# Patient Record
Sex: Female | Born: 1950
Health system: Southern US, Community
[De-identification: ages and names within clinical notes are randomized; demographics above are authoritative.]

## PROBLEM LIST (undated history)

## (undated) DIAGNOSIS — F17200 Nicotine dependence, unspecified, uncomplicated: Secondary | ICD-10-CM

## (undated) DIAGNOSIS — M549 Dorsalgia, unspecified: Secondary | ICD-10-CM

## (undated) DIAGNOSIS — E559 Vitamin D deficiency, unspecified: Secondary | ICD-10-CM

## (undated) DIAGNOSIS — K5900 Constipation, unspecified: Secondary | ICD-10-CM

## (undated) DIAGNOSIS — E669 Obesity, unspecified: Secondary | ICD-10-CM

## (undated) DIAGNOSIS — IMO0001 Reserved for inherently not codable concepts without codable children: Secondary | ICD-10-CM

## (undated) DIAGNOSIS — G5600 Carpal tunnel syndrome, unspecified upper limb: Secondary | ICD-10-CM

## (undated) HISTORY — DX: Vitamin D deficiency, unspecified: E55.9

## (undated) HISTORY — DX: Nicotine dependence, unspecified, uncomplicated: F17.200

## (undated) HISTORY — PX: DILATION AND CURETTAGE OF UTERUS: SHX78

## (undated) HISTORY — PX: WISDOM TOOTH EXTRACTION: SHX21

## (undated) HISTORY — DX: Carpal tunnel syndrome, unspecified upper limb: G56.00

## (undated) HISTORY — DX: Reserved for inherently not codable concepts without codable children: IMO0001

## (undated) HISTORY — DX: Constipation, unspecified: K59.00

## (undated) HISTORY — PX: OTHER SURGICAL HISTORY: SHX169

## (undated) HISTORY — PX: BACK SURGERY: SHX140

## (undated) HISTORY — DX: Dorsalgia, unspecified: M54.9

## (undated) HISTORY — DX: Obesity, unspecified: E66.9

---

## 1982-05-25 HISTORY — PX: TUBAL LIGATION: SHX77

## 1992-05-25 HISTORY — PX: DILATION AND CURETTAGE OF UTERUS: SHX78

## 1997-05-25 HISTORY — PX: BACK SURGERY: SHX140

## 1999-07-02 ENCOUNTER — Ambulatory Visit (HOSPITAL_COMMUNITY): Admission: RE | Admit: 1999-07-02 | Discharge: 1999-07-02 | Payer: Self-pay | Admitting: Family Medicine

## 1999-07-02 ENCOUNTER — Encounter: Payer: Self-pay | Admitting: Family Medicine

## 2000-11-27 ENCOUNTER — Emergency Department (HOSPITAL_COMMUNITY): Admission: EM | Admit: 2000-11-27 | Discharge: 2000-11-27 | Payer: Self-pay | Admitting: *Deleted

## 2000-11-27 ENCOUNTER — Encounter: Payer: Self-pay | Admitting: *Deleted

## 2004-07-29 ENCOUNTER — Ambulatory Visit: Payer: Self-pay | Admitting: Internal Medicine

## 2004-07-29 ENCOUNTER — Encounter: Admission: RE | Admit: 2004-07-29 | Discharge: 2004-07-29 | Payer: Self-pay | Admitting: Family Medicine

## 2004-08-12 ENCOUNTER — Ambulatory Visit: Payer: Self-pay | Admitting: Internal Medicine

## 2014-06-30 ENCOUNTER — Encounter: Payer: Self-pay | Admitting: Internal Medicine

## 2015-01-01 ENCOUNTER — Encounter: Payer: Self-pay | Admitting: Internal Medicine

## 2015-02-16 ENCOUNTER — Ambulatory Visit (INDEPENDENT_AMBULATORY_CARE_PROVIDER_SITE_OTHER): Payer: BLUE CROSS/BLUE SHIELD

## 2015-02-16 DIAGNOSIS — Z23 Encounter for immunization: Secondary | ICD-10-CM | POA: Diagnosis not present

## 2019-09-01 DIAGNOSIS — H524 Presbyopia: Secondary | ICD-10-CM | POA: Diagnosis not present

## 2019-09-01 DIAGNOSIS — Z0100 Encounter for examination of eyes and vision without abnormal findings: Secondary | ICD-10-CM | POA: Diagnosis not present

## 2019-09-01 DIAGNOSIS — H2513 Age-related nuclear cataract, bilateral: Secondary | ICD-10-CM | POA: Diagnosis not present

## 2019-12-07 DIAGNOSIS — Z6841 Body Mass Index (BMI) 40.0 and over, adult: Secondary | ICD-10-CM | POA: Diagnosis not present

## 2019-12-07 DIAGNOSIS — Z0189 Encounter for other specified special examinations: Secondary | ICD-10-CM | POA: Diagnosis not present

## 2019-12-07 DIAGNOSIS — K5900 Constipation, unspecified: Secondary | ICD-10-CM | POA: Diagnosis not present

## 2019-12-07 DIAGNOSIS — R030 Elevated blood-pressure reading, without diagnosis of hypertension: Secondary | ICD-10-CM | POA: Diagnosis not present

## 2019-12-07 DIAGNOSIS — Z716 Tobacco abuse counseling: Secondary | ICD-10-CM | POA: Diagnosis not present

## 2019-12-07 DIAGNOSIS — M79642 Pain in left hand: Secondary | ICD-10-CM | POA: Diagnosis not present

## 2019-12-09 DIAGNOSIS — G5602 Carpal tunnel syndrome, left upper limb: Secondary | ICD-10-CM | POA: Diagnosis not present

## 2019-12-14 DIAGNOSIS — K5900 Constipation, unspecified: Secondary | ICD-10-CM | POA: Diagnosis not present

## 2019-12-14 DIAGNOSIS — Z6841 Body Mass Index (BMI) 40.0 and over, adult: Secondary | ICD-10-CM | POA: Diagnosis not present

## 2019-12-14 DIAGNOSIS — R030 Elevated blood-pressure reading, without diagnosis of hypertension: Secondary | ICD-10-CM | POA: Diagnosis not present

## 2019-12-14 DIAGNOSIS — Z716 Tobacco abuse counseling: Secondary | ICD-10-CM | POA: Diagnosis not present

## 2019-12-14 LAB — VITAMIN D 25 HYDROXY (VIT D DEFICIENCY, FRACTURES): Vit D, 25-Hydroxy: 15.4

## 2019-12-14 LAB — COMPREHENSIVE METABOLIC PANEL: Calcium: 9.3 (ref 8.7–10.7)

## 2019-12-14 LAB — HEPATIC FUNCTION PANEL
ALT: 12 (ref 7–35)
AST: 16 (ref 13–35)
Alkaline Phosphatase: 103 (ref 25–125)
Bilirubin, Total: 0.3

## 2019-12-14 LAB — LIPID PANEL
Cholesterol: 181 (ref 0–200)
HDL: 43 (ref 35–70)
LDL Cholesterol: 111
LDl/HDL Ratio: 4.2
Triglycerides: 153 (ref 40–160)

## 2019-12-14 LAB — BASIC METABOLIC PANEL
Chloride: 104 (ref 99–108)
Creatinine: 0.8 (ref 0.5–1.1)
Glucose: 96
Potassium: 4.5 (ref 3.4–5.3)
Sodium: 138 (ref 137–147)

## 2019-12-14 LAB — CBC AND DIFFERENTIAL
HCT: 43 (ref 36–46)
Hemoglobin: 14.5 (ref 12.0–16.0)
Platelets: 281 (ref 150–399)
WBC: 6.8

## 2019-12-14 LAB — TSH: TSH: 2.1 (ref 0.41–5.90)

## 2019-12-19 DIAGNOSIS — G5602 Carpal tunnel syndrome, left upper limb: Secondary | ICD-10-CM | POA: Diagnosis not present

## 2019-12-22 ENCOUNTER — Other Ambulatory Visit (HOSPITAL_COMMUNITY): Payer: Self-pay | Admitting: Internal Medicine

## 2019-12-22 DIAGNOSIS — Z716 Tobacco abuse counseling: Secondary | ICD-10-CM | POA: Diagnosis not present

## 2019-12-22 DIAGNOSIS — F17200 Nicotine dependence, unspecified, uncomplicated: Secondary | ICD-10-CM | POA: Diagnosis not present

## 2019-12-22 DIAGNOSIS — Z6841 Body Mass Index (BMI) 40.0 and over, adult: Secondary | ICD-10-CM | POA: Diagnosis not present

## 2019-12-22 DIAGNOSIS — Z1231 Encounter for screening mammogram for malignant neoplasm of breast: Secondary | ICD-10-CM

## 2019-12-22 DIAGNOSIS — K5900 Constipation, unspecified: Secondary | ICD-10-CM | POA: Diagnosis not present

## 2019-12-22 DIAGNOSIS — R030 Elevated blood-pressure reading, without diagnosis of hypertension: Secondary | ICD-10-CM | POA: Diagnosis not present

## 2019-12-22 DIAGNOSIS — Z0189 Encounter for other specified special examinations: Secondary | ICD-10-CM | POA: Diagnosis not present

## 2019-12-22 DIAGNOSIS — M79642 Pain in left hand: Secondary | ICD-10-CM | POA: Diagnosis not present

## 2019-12-22 DIAGNOSIS — Z0001 Encounter for general adult medical examination with abnormal findings: Secondary | ICD-10-CM | POA: Diagnosis not present

## 2019-12-27 ENCOUNTER — Encounter: Payer: Self-pay | Admitting: Gastroenterology

## 2019-12-27 ENCOUNTER — Ambulatory Visit (HOSPITAL_COMMUNITY): Payer: Self-pay

## 2019-12-27 ENCOUNTER — Ambulatory Visit (HOSPITAL_COMMUNITY)
Admission: RE | Admit: 2019-12-27 | Discharge: 2019-12-27 | Disposition: A | Discharge status: Home / Self Care | Payer: Medicare HMO | Source: Ambulatory Visit | Attending: Internal Medicine | Admitting: Internal Medicine

## 2019-12-27 ENCOUNTER — Other Ambulatory Visit: Payer: Self-pay

## 2019-12-27 DIAGNOSIS — Z1231 Encounter for screening mammogram for malignant neoplasm of breast: Secondary | ICD-10-CM | POA: Insufficient documentation

## 2019-12-28 ENCOUNTER — Other Ambulatory Visit: Payer: Self-pay

## 2019-12-28 DIAGNOSIS — R202 Paresthesia of skin: Secondary | ICD-10-CM

## 2020-01-04 ENCOUNTER — Encounter: Payer: Self-pay | Admitting: Adult Health

## 2020-01-04 ENCOUNTER — Ambulatory Visit: Payer: Medicare HMO | Admitting: Adult Health

## 2020-01-04 ENCOUNTER — Other Ambulatory Visit (HOSPITAL_COMMUNITY)
Admission: RE | Admit: 2020-01-04 | Discharge: 2020-01-04 | Disposition: A | Discharge status: Home / Self Care | Payer: Medicare HMO | Source: Ambulatory Visit | Attending: Adult Health | Admitting: Adult Health

## 2020-01-04 VITALS — BP 148/80 | HR 76 | Ht 62.0 in | Wt 264.4 lb

## 2020-01-04 DIAGNOSIS — Z1151 Encounter for screening for human papillomavirus (HPV): Secondary | ICD-10-CM | POA: Diagnosis not present

## 2020-01-04 DIAGNOSIS — Z01419 Encounter for gynecological examination (general) (routine) without abnormal findings: Secondary | ICD-10-CM | POA: Diagnosis not present

## 2020-01-04 DIAGNOSIS — Z1211 Encounter for screening for malignant neoplasm of colon: Secondary | ICD-10-CM | POA: Diagnosis not present

## 2020-01-04 LAB — HEMOCCULT GUIAC POC 1CARD (OFFICE): Fecal Occult Blood, POC: NEGATIVE

## 2020-01-04 NOTE — Progress Notes (Signed)
  Subjective:     Patient ID: Amy Freeman, female   DOB: [birth year], 69 y.o.   MRN: [id number]  HPI Amy Freeman is a 69 year old white female,married, PM in for pelvic and pap. Last pap about 15 years ago. PCP is Dr Nevada Crane.  Review of Systems Has decreased libido Denies any vaginal bleeding in last 11 years, did have after trying HRT 2010 Has tingling and burin in fingers has brace left wrist, getting nerve study soon to rule out CTS. Reviewed past medical,surgical, social and family history. Reviewed medications and allergies.     Objective:   Physical Exam BP (!) 148/80 (BP Location: Right Arm, Patient Position: Sitting, Cuff Size: Normal)   Pulse 76   Ht 5\' 2"  (1.575 m)   Wt 264 lb 6.4 oz (119.9 kg)   BMI 48.36 kg/m  Skin warm and dry.Pelvic: external genitalia is normal in appearance,has small,sebaceous cyst inner labia, vagina: pale with loss of moisture and rugae,urethra has no lesions or masses noted, cervix:smooth and bulbous, uterus: normal size, shape and contour, slighlty tender, no masses felt, adnexa: no masses or tenderness noted. Bladder is non tender and no masses felt.  On rectal exam has good tone, no masses or hemorrhoids felt, hemoccult ws negative.  Upstream - 01/04/20 1057      Pregnancy Intention Screening   Does the patient want to become pregnant in the next year? No    Does the patient's partner want to become pregnant in the next year? No    Would the patient like to discuss contraceptive options today? No      Contraception Wrap Up   Current Method Female Sterilization   postmenopausal   End Method Female Sterilization   postmenopausal   Contraception Counseling Provided No         Fall risk is low AA is 0 PHQ 9 score is 0 Examination chaperoned by Celene Squibb LPN     Assessment:     1. Encounter for gynecological examination with Papanicolaou smear of cervix Pap sent Physical with PCP Labs with PCP Mammogram yearly -colonoscopy in  October   2. Encounter for screening fecal occult blood testing     Plan:     Pap in 3 years if normal

## 2020-01-05 DIAGNOSIS — Z716 Tobacco abuse counseling: Secondary | ICD-10-CM | POA: Diagnosis not present

## 2020-01-05 DIAGNOSIS — F17200 Nicotine dependence, unspecified, uncomplicated: Secondary | ICD-10-CM | POA: Diagnosis not present

## 2020-01-08 LAB — CYTOLOGY - PAP
Comment: NEGATIVE
Diagnosis: NEGATIVE
High risk HPV: NEGATIVE

## 2020-01-11 ENCOUNTER — Other Ambulatory Visit: Payer: Self-pay

## 2020-01-11 ENCOUNTER — Ambulatory Visit (INDEPENDENT_AMBULATORY_CARE_PROVIDER_SITE_OTHER): Payer: Medicare HMO | Admitting: Family Medicine

## 2020-01-11 ENCOUNTER — Encounter (INDEPENDENT_AMBULATORY_CARE_PROVIDER_SITE_OTHER): Payer: Self-pay | Admitting: Family Medicine

## 2020-01-11 VITALS — BP 138/79 | HR 73 | Temp 97.9°F | Ht 60.0 in | Wt 260.0 lb

## 2020-01-11 DIAGNOSIS — Z6841 Body Mass Index (BMI) 40.0 and over, adult: Secondary | ICD-10-CM

## 2020-01-11 DIAGNOSIS — E7849 Other hyperlipidemia: Secondary | ICD-10-CM

## 2020-01-11 DIAGNOSIS — M549 Dorsalgia, unspecified: Secondary | ICD-10-CM

## 2020-01-11 DIAGNOSIS — K5909 Other constipation: Secondary | ICD-10-CM

## 2020-01-11 DIAGNOSIS — E559 Vitamin D deficiency, unspecified: Secondary | ICD-10-CM | POA: Diagnosis not present

## 2020-01-11 DIAGNOSIS — F17200 Nicotine dependence, unspecified, uncomplicated: Secondary | ICD-10-CM | POA: Diagnosis not present

## 2020-01-11 DIAGNOSIS — R5383 Other fatigue: Secondary | ICD-10-CM

## 2020-01-11 DIAGNOSIS — Z1331 Encounter for screening for depression: Secondary | ICD-10-CM

## 2020-01-11 DIAGNOSIS — R0602 Shortness of breath: Secondary | ICD-10-CM

## 2020-01-11 DIAGNOSIS — Z0289 Encounter for other administrative examinations: Secondary | ICD-10-CM

## 2020-01-11 DIAGNOSIS — G8929 Other chronic pain: Secondary | ICD-10-CM

## 2020-01-11 NOTE — Progress Notes (Signed)
Dear Dr. Nevada Crane,   Thank you for referring Amy Freeman to our clinic. The following note includes my evaluation and treatment recommendations.  Chief Complaint:   OBESITY Amy Freeman (MR# [id number]) is a 69 y.o. female who presents for evaluation and treatment of obesity and related comorbidities. Current BMI is Body mass index is 50.78 kg/m. Amy Freeman has been struggling with her weight for many years and has been unsuccessful in either losing weight, maintaining weight loss, or reaching her healthy weight goal.  Amy Freeman is currently in the action stage of change and ready to dedicate time achieving and maintaining a healthier weight. Amy Freeman is interested in becoming our patient and working on intensive lifestyle modifications including (but not limited to) diet and exercise for weight loss.  Amy Freeman is retired since 2008.  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  She lives with her husband, Amy Freeman.  Her first meal of the day is at noon.  She skips breakfast daily due to getting up later.  She had labs (CBC, CMP, FLP, TSH, and vitamin D) with her PCP recently on 12/14/2019.  Amy Freeman's habits were reviewed today and are as follows: Her family eats meals together, she thinks her family will eat healthier with her, her desired weight loss is 119+ pounds, she has been heavy most of her life, her heaviest weight ever was 270 pounds, she is a picky eater and doesn't like to eat healthier foods, she craves sweet and salty foods and something quick and easy, she skips breakfast frequently, she is frequently drinking liquids with calories, she frequently makes poor food choices, she frequently eats larger portions than normal and she struggles with emotional eating.  Depression Screen Amy Freeman's Food and Mood (modified PHQ-9) score was 13.  Depression screen PHQ 2/9 01/11/2020  Decreased Interest 3  Down, Depressed, Hopeless 3  PHQ - 2 Score 6  Altered sleeping 1  Tired,  decreased energy 2  Change in appetite 2  Feeling bad or failure about yourself  2  Trouble concentrating 0  Moving slowly or fidgety/restless 0  Suicidal thoughts 0  PHQ-9 Score 13  Difficult doing work/chores Very difficult   Subjective:   1. Other fatigue Charmayne denies daytime somnolence and denies waking up still tired. Patent has a history of symptoms of occasional snoring. Amy Freeman generally gets 7 or 8 hours of sleep per night, and states that she has generally restful sleep. Snoring is sometimes present. Apneic episodes are not present. Epworth Sleepiness Score is 2.  2. SOB (shortness of breath) on exertion Gregory notes increasing shortness of breath with exercising and seems to be worsening over time with weight gain. She notes getting out of breath sooner with activity than she used to. This has gotten worse recently. Josy denies shortness of breath at rest or orthopnea.  3. Smoking Livy has smoked 1 pack per day for 40+ years.  She is currently taking Chantix to try to quit.  She was on Wellbutrin years ago and says it did not help at all.  4. Vitamin D deficiency Amy Freeman's Vitamin D level was 15.4 on 12/14/2019. She is currently taking prescription vitamin D 50,000 IU each week. She denies nausea, vomiting or muscle weakness.  5. Chronic constipation CBC, CMP on 12/14/2019 were all within normal limits.  6. Chronic back pain, unspecified back location, unspecified back pain laterality Itxel is treated for this by her PCP.  7. Other hyperlipidemia Amy Freeman has hyperlipidemia  and has been trying to improve her cholesterol levels with intensive lifestyle modification including a low saturated fat diet, exercise and weight loss. She denies any chest pain, claudication or myalgias.  Labs on 12/14/2019 showed elevated LDL to 111 and triglycerides at 153.  HDL 43.  She is on no medication and would like to try lifestyle changes.  8. Depression screen Amy Freeman was screened for depression as  part of her new patient workup.  Assessment/Plan:   1. Other fatigue Amy Freeman does feel that her weight is causing her energy to be lower than it should be. Fatigue may be related to obesity, depression or many other causes. Labs will be ordered, and in the meanwhile, Amy Freeman will focus on self care including making healthy food choices, increasing physical activity and focusing on stress reduction. - EKG 12-Lead - Hemoglobin A1c - Insulin, random - Vitamin B12 - Folate - T3 - T4  2. SOB (shortness of breath) on exertion Amy Freeman does feel that she gets out of breath more easily that she used to when she exercises. Amy Freeman's shortness of breath appears to be obesity related and exercise induced. She has agreed to work on weight loss and gradually increase exercise to treat her exercise induced shortness of breath. Will continue to monitor closely.  3. Smoking Very strongly urged to quit smoking to reduce cardiovascular risk.  4. Vitamin D deficiency Low Vitamin D level contributes to fatigue and are associated with obesity, breast, and colon cancer. She agrees to continue to take prescription Vitamin D @50 ,000 IU every week and will follow-up for routine testing of Vitamin D, at least 2-3 times per year to avoid over-replacement.  5. Chronic constipation Amy Freeman was informed that a decrease in bowel movement frequency is normal while losing weight, but stools should not be hard or painful. Orders and follow up as documented in patient record.  Increase hydration, increase fiber by following prudent nutritional plan.  Counseling Getting to Good Bowel Health: Your goal is to have one soft bowel movement each day. Drink at least 8 glasses of water each day. Eat plenty of fiber (goal is over 25 grams each day). It is best to get most of your fiber from dietary sources which includes leafy green vegetables, fresh fruit, and whole grains. You may need to add fiber with the help of OTC fiber supplements.  These include Metamucil, Citrucel, and Flaxseed. If you are still having trouble, try adding Miralax or Magnesium Citrate. If all of these changes do not work, Cabin crew.  6. Chronic back pain, unspecified back location, unspecified back pain laterality Will follow because mobility and pain control are important for weight management.  Weight loss will be helpful.  7. Other hyperlipidemia Cardiovascular risk and specific lipid/LDL goals reviewed.  We discussed several lifestyle modifications today and Aubreigh will continue to work on diet, exercise and weight loss efforts. Orders and follow up as documented in patient record.   Counseling Intensive lifestyle modifications are the first line treatment for this issue. . Dietary changes: Increase soluble fiber. Decrease simple carbohydrates. . Exercise changes: Moderate to vigorous-intensity aerobic activity 150 minutes per week if tolerated. . Lipid-lowering medications: see documented in medical record.  8. Depression screen Numa had a positive depression screening. Depression is commonly associated with obesity and often results in emotional eating behaviors. We will monitor this closely and work on CBT to help improve the non-hunger eating patterns. Referral to Psychology may be required if no improvement is seen as  she continues in our clinic.  9. Class 3 severe obesity with serious comorbidity and body mass index (BMI) of 50.0 to 59.9 in adult, unspecified obesity type [facility]) [place] is currently in the action stage of change and her goal is to continue with weight loss efforts. I recommend Shomari begin the structured treatment plan as follows:  She has agreed to the Category 1 Plan.  Exercise goals: No exercise has been prescribed at this time. As is.   Behavioral modification strategies: decreasing liquid calories, no skipping meals, meal planning and cooking strategies and planning for success.  She was informed of the  importance of frequent follow-up visits to maximize her success with intensive lifestyle modifications for her multiple health conditions. She was informed we would discuss her lab results at her next visit unless there is a critical issue that needs to be addressed sooner. Niara agreed to keep her next visit at the agreed upon time to discuss these results.  Objective:   Blood pressure 138/79, pulse 73, temperature 97.9 F (36.6 C), height 5' (1.524 m), weight 260 lb (117.9 kg), SpO2 96 %. Body mass index is 50.78 kg/m.  EKG: Normal sinus rhythm, rate 74 bpm.  Indirect Calorimeter completed today shows a VO2 of 224 and a REE of 1559.  Her calculated basal metabolic rate is 6269 thus her basal metabolic rate is worse than expected.  General: Cooperative, alert, well developed, in no acute distress. HEENT: Conjunctivae and lids unremarkable. Cardiovascular: Regular rhythm.  Lungs: Normal work of breathing. Neurologic: No focal deficits.   Obesity Behavioral Intervention Visit Documentation for Insurance:   Approximately 15 minutes were spent on the discussion below.  ASK: We discussed the diagnosis of obesity with Bessye today and Arya agreed to give [place] permission to discuss obesity behavioral modification therapy today.  ASSESS: Taygen has the diagnosis of obesity and her BMI today is 50.2. Wessie is in the action stage of change.   ADVISE: Ivie was educated on the multiple health risks of obesity as well as the benefit of weight loss to improve her health. She was advised of the need for long term treatment and the importance of lifestyle modifications to improve her current health and to decrease her risk of future health problems.  AGREE: Multiple dietary modification options and treatment options were discussed and Modesty agreed to follow the recommendations documented in the above note.  ARRANGE: Bennette was educated on the importance of frequent visits to treat obesity as  outlined per CMS and USPSTF guidelines and agreed to schedule her next follow up appointment today.  Attestation Statements:   Reviewed by clinician on day of visit: allergies, medications, problem list, medical history, surgical history, family history, social history, and previous encounter notes.  I, Water quality scientist, CMA, am acting as Location manager for [employer], DO.  I have reviewed the above documentation for accuracy and completeness, and I agree with the above. -  Mellody Dance, DO

## 2020-01-12 LAB — FOLATE: Folate: 12.5 ng/mL (ref >3.0)

## 2020-01-12 LAB — INSULIN, RANDOM: INSULIN: 14 u[IU]/mL (ref 2.6–24.9)

## 2020-01-12 LAB — T3: T3, Total: 129 ng/dL (ref 71–180)

## 2020-01-12 LAB — HEMOGLOBIN A1C
Est. average glucose Bld gHb Est-mCnc: 128 mg/dL
Hgb A1c MFr Bld: 6.1 % — ABNORMAL HIGH (ref 4.8–5.6)

## 2020-01-12 LAB — VITAMIN B12: Vitamin B-12: 729 pg/mL (ref 232–1245)

## 2020-01-12 LAB — T4: T4, Total: 8.1 ug/dL (ref 4.5–12.0)

## 2020-01-15 ENCOUNTER — Other Ambulatory Visit (INDEPENDENT_AMBULATORY_CARE_PROVIDER_SITE_OTHER): Payer: Self-pay

## 2020-01-15 ENCOUNTER — Encounter (INDEPENDENT_AMBULATORY_CARE_PROVIDER_SITE_OTHER): Payer: Self-pay | Admitting: Family Medicine

## 2020-01-16 ENCOUNTER — Other Ambulatory Visit: Payer: Self-pay

## 2020-01-16 ENCOUNTER — Ambulatory Visit (INDEPENDENT_AMBULATORY_CARE_PROVIDER_SITE_OTHER): Payer: Medicare HMO | Admitting: Neurology

## 2020-01-16 DIAGNOSIS — G5602 Carpal tunnel syndrome, left upper limb: Secondary | ICD-10-CM

## 2020-01-16 DIAGNOSIS — R202 Paresthesia of skin: Secondary | ICD-10-CM | POA: Diagnosis not present

## 2020-01-16 NOTE — Procedures (Signed)
[facility]  [address], [place]  [place], [place] [address] Tel: [phone] Fax:  [phone] Test Date:  01/16/2020  Patient: Amy Freeman DOB: Nov 30, 1950 Physician: Narda Amber, DO  Sex: Female Height: 5' " Ref Phys: Berle Mull, M.D.  ID#: [id number] Temp: 35.0 Technician:    Patient Complaints: This is a 69 year old female referred for evaluation of left hand numbness and tingling.  NCV & EMG Findings: Extensive electrodiagnostic testing of the left upper extremity shows:  1. Left median sensory response is absent.  Left ulnar sensory responses within normal limits. 2. Left median motor response shows prolonged distal onset latency (6.4 ms) and reduced amplitude (3.8 mV).  Left ulnar motor responses within normal limits.   3. Chronic motor axonal loss changes are seen affecting the left abductor pollicis brevis muscle, without accompanying active denervation.    Impression: 1. Left median neuropathy at or distal to the wrist (severe), consistent with a clinical diagnosis of carpal tunnel syndrome.  2. There is no evidence of a cervical radiculopathy affecting the left upper extremity.   ___________________________ Narda Amber, DO    Nerve Conduction Studies Anti Sensory Summary Table   Stim Site NR Peak (ms) Norm Peak (ms) P-T Amp (V) Norm P-T Amp  Left Median Anti Sensory (2nd Digit)  35C  Wrist NR  <3.8  >10  Left Ulnar Anti Sensory (5th Digit)  35C  Wrist    2.6 <3.2 21.0 >5   Motor Summary Table   Stim Site NR Onset (ms) Norm Onset (ms) O-P Amp (mV) Norm O-P Amp Site1 Site2 Delta-0 (ms) Dist (cm) Vel (m/s) Norm Vel (m/s)  Left Median Motor (Abd Poll Brev)  35C  Wrist    6.4 <4.0 3.8 >5 Elbow Wrist 5.1 26.0 51 >50  Elbow    11.5  3.7         Left Ulnar Motor (Abd Dig Minimi)  35C  Wrist    2.3 <3.1 7.4 >7 B Elbow Wrist 3.8 22.0 58 >50  B Elbow    6.1  6.5  A Elbow B Elbow 1.8 10.0 56 >50  A Elbow    7.9  6.2          EMG   Side  Muscle Ins Act Fibs Psw Fasc Number Recrt Dur Dur. Amp Amp. Poly Poly. Comment  Left 1stDorInt Nml Nml Nml Nml Nml Nml Nml Nml Nml Nml Nml Nml N/A  Left Abd Poll Brev Nml Nml Nml Nml 3- Rapid Most 1+ Most 1+ Most 1+ N/A  Left PronatorTeres Nml Nml Nml Nml Nml Nml Nml Nml Nml Nml Nml Nml N/A  Left Biceps Nml Nml Nml Nml Nml Nml Nml Nml Nml Nml Nml Nml N/A  Left Triceps Nml Nml Nml Nml Nml Nml Nml Nml Nml Nml Nml Nml N/A  Left Deltoid Nml Nml Nml Nml Nml Nml Nml Nml Nml Nml Nml Nml N/A      Waveforms:

## 2020-01-22 DIAGNOSIS — G5602 Carpal tunnel syndrome, left upper limb: Secondary | ICD-10-CM | POA: Diagnosis not present

## 2020-01-25 ENCOUNTER — Encounter (INDEPENDENT_AMBULATORY_CARE_PROVIDER_SITE_OTHER): Payer: Self-pay | Admitting: Family Medicine

## 2020-01-25 ENCOUNTER — Other Ambulatory Visit: Payer: Self-pay

## 2020-01-25 ENCOUNTER — Ambulatory Visit (INDEPENDENT_AMBULATORY_CARE_PROVIDER_SITE_OTHER): Payer: Medicare HMO | Admitting: Family Medicine

## 2020-01-25 VITALS — BP 111/75 | HR 89 | Temp 98.4°F | Ht 60.0 in | Wt 248.0 lb

## 2020-01-25 DIAGNOSIS — F17200 Nicotine dependence, unspecified, uncomplicated: Secondary | ICD-10-CM

## 2020-01-25 DIAGNOSIS — K5909 Other constipation: Secondary | ICD-10-CM

## 2020-01-25 DIAGNOSIS — Z6841 Body Mass Index (BMI) 40.0 and over, adult: Secondary | ICD-10-CM

## 2020-01-25 DIAGNOSIS — R7303 Prediabetes: Secondary | ICD-10-CM

## 2020-01-25 DIAGNOSIS — Z9189 Other specified personal risk factors, not elsewhere classified: Secondary | ICD-10-CM

## 2020-01-30 NOTE — Progress Notes (Signed)
Chief Complaint:   OBESITY Amy Freeman is here to discuss her progress with her obesity treatment plan along with follow-up of her obesity related diagnoses. Amy Freeman is on the Category 1 Plan and states she is following her eating plan approximately 97% of the time. Amy Freeman states she is walking for 30-45 minutes 2-3 times per week.  Today's visit was #: 2 Starting weight: 260 lbs Starting date: 01/11/2020 Today's weight: 248 lbs Today's date: 01/25/2020 Total lbs lost to date: 12 lbs Total lbs lost since last in-office visit: 12 lbs  Interim History:  Amy Freeman says the plan went well.  This is her first follow-up visit.    She says her hunger and cravings are controled.  She says that one night she felt hungry at the end of the afternoon while waiting for her husband for dinner, so she ate some of her dinner early. This only happened once since she ate lunch earlier that day.  But otherwise she has no concerns.   Subjective:   1. Chronic constipation Amy Freeman takes Linzess and has no issues with her constipation.  2. Prediabetes Amy Freeman has a diagnosis of prediabetes based on her elevated HgA1c and was informed this puts her at greater risk of developing diabetes. She continues to work on diet and exercise to decrease her risk of diabetes. She denies nausea or hypoglycemia.  She has never been told she has prediabetes prior.  Positive family history of diabetes.  Lab Results  Component Value Date   HGBA1C 6.1 (H) 01/11/2020   Lab Results  Component Value Date   INSULIN 14.0 01/11/2020   3. Smoking Amy Freeman is still smoking, but less than at prior office visit.  She is on Chantix, still smoking.  Denies side effects of Chantix and is tolerating it well.  No emotional disturbance.  4. At risk for diabetes mellitus - Amy Freeman was given extensive diabetes prevention education and counseling today of more than 7.5 minutes.  - Counseled patient on pathophysiology of disease and discussed various  treatment options which always includes dietary and lifestyle modification as first line.   - Importance of healthy diet with very limited amounts of simple carbohydrates discussed with patient in addition to regular aerobic exercise of 79min 5d/week or more.  - Handouts provided at patient's desire and or told to go online at the American Diabetes Association website for further information   Assessment/Plan:   1. Chronic constipation Discussed labs with patient today.  Continue Linzess along with increasing water intake.  Pay attention to fiber in foods as well.  Follow meal plan.  2. Prediabetes New.   Discussed labs with patient today.   Amy Freeman will continue to work on weight loss, exercise, and decreasing simple carbohydrates to help decrease the risk of diabetes.  Decrease simple carbs, and I encouraged healthier snacks with less carbs/simple sugars.  Increase fiber and increase protein.  Follow meal plan and lose weight.  Recheck labs in 3 months.  3. Smoking Continue Chantix per PCP as she is tolerating it well.  Encouraged smoking cessation.  4. At risk for diabetes mellitus - Amy Freeman was given extensive diabetes prevention education and counseling today of more than 15 minutes.  - Counseled patient on pathophysiology of disease and discussed various treatment options which always includes dietary and lifestyle modification as first line.   - Importance of healthy diet with very limited amounts of simple carbohydrates discussed with patient in addition to regular aerobic exercise of 68min 5d/week or  more.  - Handouts provided at patient's desire and or told to go online at the American Diabetes Association website for further information  Evidence-based interventions for health behavior change were utilized today including the discussion of self monitoring techniques, problem-solving barriers and SMART goal setting techniques.     5. Class 3 severe obesity with serious comorbidity  and body mass index (BMI) of 45.0 to 49.9 in adult, unspecified obesity type [facility]) Amy Freeman is currently in the action stage of change. As such, her goal is to continue with weight loss efforts. She has agreed to the Category 1 Plan.   Exercise goals: As is.  Behavioral modification strategies: ways to avoid boredom eating and better snacking choices (Amy Freeman).   Amy Freeman has agreed to follow-up with our clinic in 2 weeks. She was informed of the importance of frequent follow-up visits to maximize her success with intensive lifestyle modifications for her multiple health conditions.    Objective:   Blood pressure 111/75, pulse 89, temperature 98.4 F (36.9 C), height 5' (1.524 m), weight 248 lb (112.5 kg), SpO2 95 %. Body mass index is 48.43 kg/m.  General: Cooperative, alert, well developed, in no acute distress. HEENT: Conjunctivae and lids unremarkable. Cardiovascular: Regular rhythm.  Lungs: Normal work of breathing. Neurologic: No focal deficits.   Lab Results  Component Value Date   CREATININE 0.8 12/14/2019   NA 138 12/14/2019   K 4.5 12/14/2019   CL 104 12/14/2019   Lab Results  Component Value Date   ALT 12 12/14/2019   AST 16 12/14/2019   ALKPHOS 103 12/14/2019   Lab Results  Component Value Date   HGBA1C 6.1 (H) 01/11/2020   Lab Results  Component Value Date   INSULIN 14.0 01/11/2020   Lab Results  Component Value Date   TSH 2.10 12/14/2019   Lab Results  Component Value Date   CHOL 181 12/14/2019   HDL 43 12/14/2019   LDLCALC 111 12/14/2019   TRIG 153 12/14/2019   Lab Results  Component Value Date   WBC 6.8 12/14/2019   HGB 14.5 12/14/2019   HCT 43 12/14/2019   PLT 281 12/14/2019   Attestation Statements:   Reviewed by clinician on day of visit: allergies, medications, problem list, medical history, surgical history, family history, social history, and previous encounter notes.  I, Water quality scientist, CMA, am acting as Location manager for [employer], DO.  I have reviewed the above documentation for accuracy and completeness, and I agree with the above. Mellody Dance, DO

## 2020-02-07 ENCOUNTER — Ambulatory Visit (INDEPENDENT_AMBULATORY_CARE_PROVIDER_SITE_OTHER): Payer: Medicare HMO | Admitting: Family Medicine

## 2020-02-12 ENCOUNTER — Encounter (INDEPENDENT_AMBULATORY_CARE_PROVIDER_SITE_OTHER): Payer: Self-pay | Admitting: Family Medicine

## 2020-02-12 ENCOUNTER — Other Ambulatory Visit: Payer: Self-pay

## 2020-02-12 ENCOUNTER — Ambulatory Visit (INDEPENDENT_AMBULATORY_CARE_PROVIDER_SITE_OTHER): Payer: Medicare HMO | Admitting: Family Medicine

## 2020-02-12 VITALS — BP 125/78 | HR 76 | Temp 97.7°F | Ht 60.0 in | Wt 244.0 lb

## 2020-02-12 DIAGNOSIS — Z6841 Body Mass Index (BMI) 40.0 and over, adult: Secondary | ICD-10-CM | POA: Diagnosis not present

## 2020-02-12 DIAGNOSIS — E559 Vitamin D deficiency, unspecified: Secondary | ICD-10-CM | POA: Diagnosis not present

## 2020-02-12 DIAGNOSIS — E7849 Other hyperlipidemia: Secondary | ICD-10-CM

## 2020-02-12 DIAGNOSIS — Z9189 Other specified personal risk factors, not elsewhere classified: Secondary | ICD-10-CM

## 2020-02-12 DIAGNOSIS — R7303 Prediabetes: Secondary | ICD-10-CM | POA: Diagnosis not present

## 2020-02-12 MED ORDER — VITAMIN D (ERGOCALCIFEROL) 1.25 MG (50000 UNIT) PO CAPS: 50000.0000 [IU] | ORAL_CAPSULE | ORAL | 0 refills | Status: DC

## 2020-02-14 NOTE — Progress Notes (Signed)
Chief Complaint:   OBESITY Amy Freeman is here to discuss her progress with her obesity treatment plan along with follow-up of her obesity related diagnoses. Amy Freeman is on the Category 1 Plan and states she is following her eating plan approximately 80% of the time. Amy Freeman states she is walking for 30-60 minutes 2 times per week.  Today's visit was #: 3 Starting weight: 260 lbs Starting date: 01/11/2020 Today's weight: 244 lbs Today's date: 02/12/2020 Total lbs lost to date: 16 lbs Total lbs lost since last in-office visit: 4 lbs  Interim History: Amy Freeman says she fell back into her bad habits for 2-3 days last week.  She ate off plan and ate unhealthy foods.  She is surprised by her weight loss.  She says the other days she got back on plan.  Denies issues with meal plan.  Hunger and cravings are well-controlled.  Subjective:   1. Prediabetes Amy Freeman has a diagnosis of prediabetes based on her elevated HgA1c and was informed this puts her at greater risk of developing diabetes. She continues to work on diet and exercise to decrease her risk of diabetes. She denies nausea or hypoglycemia.  This was new onset for her.    Lab Results  Component Value Date   HGBA1C 6.1 (H) 01/11/2020   Lab Results  Component Value Date   INSULIN 14.0 01/11/2020   2. Vitamin D deficiency Amy Freeman's Vitamin D level was 15.4 on 12/14/2019. She is currently taking prescription vitamin D 50,000 IU each week. She denies nausea, vomiting or muscle weakness.    3. Other hyperlipidemia Amy Freeman has hyperlipidemia and has been trying to improve her cholesterol levels with intensive lifestyle modification including a low saturated fat diet, exercise and weight loss. She denies any chest pain, claudication or myalgias.    Lab Results  Component Value Date   ALT 12 12/14/2019   AST 16 12/14/2019   ALKPHOS 103 12/14/2019   Lab Results  Component Value Date   CHOL 181 12/14/2019   HDL 43 12/14/2019   LDLCALC 111  12/14/2019   TRIG 153 12/14/2019   4. At risk for heart disease Due to Amy Freeman's current state of health and medical condition(s), she is at a higher risk for heart disease.   This puts the patient at much greater risk to subsequently develop cardiopulmonary conditions that can significantly affect patient's quality of life in a negative manner as well.    At least 15+ minutes was spent on counseling Amy Freeman about these concerns today and I stressed the importance of reversing risks factors of obesity, esp truncal and visceral fat, hypertension, hyperlipidemia, pre-diabetes.   Initial goal is to lose at least 5-10% of starting weight to help reduce these risk factors.   Counseling: Intensive lifestyle modifications discussed with Saylee as most appropriate first line treatment.  she will continue to work on diet, exercise and weight loss efforts.  We will continue to reassess these conditions on a fairly regular basis in an attempt to decrease patient's overall morbidity and mortality.  Evidence-based interventions for health behavior change were utilized today including the discussion of self monitoring techniques, problem-solving barriers and SMART goal setting techniques.  Specifically regarding patient's less desirable eating habits and patterns, we employed the technique of small changes when Amy Freeman has not been able to fully commit to her prudent nutritional plan.  Assessment/Plan:   1. Prediabetes Discussed labs with patient today.  Amy Freeman will continue to work on weight loss, exercise, and decreasing  simple carbohydrates to help decrease the risk of diabetes.  Continue prudent nutritional plan, weight loss.  Eventually will increase exercise.  Will monitor every 3 months.  2. Vitamin D deficiency Discussed labs with patient today.  Low Vitamin D level contributes to fatigue and are associated with obesity, breast, and colon cancer. She agrees to continue to take prescription Vitamin D @50 ,000 IU  every week and will follow-up for routine testing of Vitamin D, at least 2-3 times per year to avoid over-replacement.  Will recheck vitamin D level after 2-3 months of taking it.  -Refill Vitamin D, Ergocalciferol, (DRISDOL) 1.25 MG (50000 UNIT) CAPS capsule; Take 1 capsule (50,000 Units total) by mouth every 7 (seven) days.  Dispense: 4 capsule; Refill: 0  3. Other hyperlipidemia Discussed labs with patient today.  Cardiovascular risk and specific lipid/LDL goals reviewed.  We discussed several lifestyle modifications today and Amy Freeman will continue to work on diet, exercise and weight loss efforts. Orders and follow up as documented in patient record.  Recheck after 3 months or so and hopefully 10% of weight loss (26 pounds by then) will have occurred.  Continue weight loss, prudent nutritional plan, and exercise.  Counseling Intensive lifestyle modifications are the first line treatment for this issue. . Dietary changes: Increase soluble fiber. Decrease simple carbohydrates. . Exercise changes: Moderate to vigorous-intensity aerobic activity 150 minutes per week if tolerated. . Lipid-lowering medications: see documented in medical record.  4. At risk for heart disease Due to Amy Freeman's current state of health and medical condition(s), she is at a higher risk for heart disease.   This puts the patient at much greater risk to subsequently develop cardiopulmonary conditions that can significantly affect patient's quality of life in a negative manner as well.    At least 15+ minutes was spent on counseling Amy Freeman about these concerns today and I stressed the importance of reversing risks factors of obesity, esp truncal and visceral fat, hypertension, hyperlipidemia, pre-diabetes.   Initial goal is to lose at least 5-10% of starting weight to help reduce these risk factors.   Counseling: Intensive lifestyle modifications discussed with Amy Freeman as most appropriate first line treatment.  she will continue to  work on diet, exercise and weight loss efforts.  We will continue to reassess these conditions on a fairly regular basis in an attempt to decrease patient's overall morbidity and mortality.  Evidence-based interventions for health behavior change were utilized today including the discussion of self monitoring techniques, problem-solving barriers and SMART goal setting techniques.  Specifically regarding patient's less desirable eating habits and patterns, we employed the technique of small changes when Amy Freeman has not been able to fully commit to her prudent nutritional plan.  5. Class 3 severe obesity with serious comorbidity and body mass index (BMI) of 45.0 to 49.9 in adult, unspecified obesity type [facility]) Amy Freeman is currently in the action stage of change. As such, her goal is to continue with weight loss efforts. She has agreed to the Category 1 Plan.   Exercise goals: For substantial health benefits, adults should do at least 150 minutes (2 hours and 30 minutes) a week of moderate-intensity, or 75 minutes (1 hour and 15 minutes) a week of vigorous-intensity aerobic physical activity, or an equivalent combination of moderate- and vigorous-intensity aerobic activity. Aerobic activity should be performed in episodes of at least 10 minutes, and preferably, it should be spread throughout the week. 15 minutes in the morning and 15 minutes at night 5 days per week.  Behavioral modification strategies: increasing lean protein intake, decreasing simple carbohydrates, decreasing eating out, no skipping meals and planning for success.  Amy Freeman has agreed to follow-up with our clinic in 2 weeks. She was informed of the importance of frequent follow-up visits to maximize her success with intensive lifestyle modifications for her multiple health conditions.   Objective:   Blood pressure 125/78, pulse 76, temperature 97.7 F (36.5 C), height 5' (1.524 m), weight 244 lb (110.7 kg), SpO2 97 %. Body mass index is  47.65 kg/m.  General: Cooperative, alert, well developed, in no acute distress. HEENT: Conjunctivae and lids unremarkable. Cardiovascular: Regular rhythm.  Lungs: Normal work of breathing. Neurologic: No focal deficits.   Lab Results  Component Value Date   CREATININE 0.8 12/14/2019   NA 138 12/14/2019   K 4.5 12/14/2019   CL 104 12/14/2019   Lab Results  Component Value Date   ALT 12 12/14/2019   AST 16 12/14/2019   ALKPHOS 103 12/14/2019   Lab Results  Component Value Date   HGBA1C 6.1 (H) 01/11/2020   Lab Results  Component Value Date   INSULIN 14.0 01/11/2020   Lab Results  Component Value Date   TSH 2.10 12/14/2019   Lab Results  Component Value Date   CHOL 181 12/14/2019   HDL 43 12/14/2019   LDLCALC 111 12/14/2019   TRIG 153 12/14/2019   Lab Results  Component Value Date   WBC 6.8 12/14/2019   HGB 14.5 12/14/2019   HCT 43 12/14/2019   PLT 281 12/14/2019   Attestation Statements:   Reviewed by clinician on day of visit: allergies, medications, problem list, medical history, surgical history, family history, social history, and previous encounter notes.  I, Water quality scientist, CMA, am acting as Location manager for [employer], DO.  I have reviewed the above documentation for accuracy and completeness, and I agree with the above. Mellody Dance, DO

## 2020-02-19 ENCOUNTER — Encounter: Payer: Self-pay | Admitting: Gastroenterology

## 2020-02-19 ENCOUNTER — Ambulatory Visit (AMBULATORY_SURGERY_CENTER): Payer: Medicare HMO

## 2020-02-19 ENCOUNTER — Other Ambulatory Visit: Payer: Self-pay

## 2020-02-19 VITALS — Ht 60.0 in | Wt 250.6 lb

## 2020-02-19 DIAGNOSIS — Z1211 Encounter for screening for malignant neoplasm of colon: Secondary | ICD-10-CM

## 2020-02-19 MED ORDER — SUTAB 1479-225-188 MG PO TABS: 1.0000 | ORAL_TABLET | ORAL | 0 refills | Status: DC

## 2020-02-19 NOTE — Progress Notes (Signed)
No egg or soy allergy known to patient  No issues with past sedation with any surgeries or procedures No intubation problems in the past  No FH of Malignant Hyperthermia No diet pills per patient No home 02 use per patient  No blood thinners per patient  Pt reports issues with constipation ---is taking Linzess and is having daily bowel movements No A fib or A flutter  EMMI video via [place] 19 guidelines implemented in [place] today with Pt and RN  Coupon given to pt in [place] today , Code to Pharmacy  COVID vaccines completed on 08/2019 per pt;  Due to the COVID-19 pandemic we are asking patients to follow these guidelines. Please only bring one care partner. Please be aware that your care partner may wait in the car in the parking lot or if they feel like they will be too hot to wait in the car, they may wait in the lobby on the 4th floor. All care partners are required to wear a mask the entire time (we do not have any that we can provide them), they need to practice social distancing, and we will do a Covid check for all patient's and care partners when you arrive. Also we will check their temperature and your temperature. If the care partner waits in their car they need to stay in the parking lot the entire time and we will call them on their cell phone when the patient is ready for discharge so they can bring the car to the front of the building. Also all patient's will need to wear a mask into building.

## 2020-02-26 ENCOUNTER — Ambulatory Visit (INDEPENDENT_AMBULATORY_CARE_PROVIDER_SITE_OTHER): Payer: Medicare HMO | Admitting: Family Medicine

## 2020-02-26 ENCOUNTER — Encounter (INDEPENDENT_AMBULATORY_CARE_PROVIDER_SITE_OTHER): Payer: Self-pay | Admitting: Family Medicine

## 2020-02-26 ENCOUNTER — Other Ambulatory Visit: Payer: Self-pay

## 2020-02-26 VITALS — BP 122/73 | HR 76 | Temp 98.6°F | Ht 60.0 in | Wt 244.0 lb

## 2020-02-26 DIAGNOSIS — Z720 Tobacco use: Secondary | ICD-10-CM

## 2020-02-26 DIAGNOSIS — R7303 Prediabetes: Secondary | ICD-10-CM

## 2020-02-26 DIAGNOSIS — F32A Depression, unspecified: Secondary | ICD-10-CM | POA: Diagnosis not present

## 2020-02-26 DIAGNOSIS — Z6841 Body Mass Index (BMI) 40.0 and over, adult: Secondary | ICD-10-CM | POA: Diagnosis not present

## 2020-02-26 MED ORDER — BUPROPION HCL ER (SR) 100 MG PO TB12: 100.0000 mg | ORAL_TABLET | Freq: Two times a day (BID) | ORAL | 0 refills | Status: DC

## 2020-02-27 NOTE — Progress Notes (Signed)
Chief Complaint:   OBESITY Amy Freeman is here to discuss her progress with her obesity treatment plan along with follow-up of her obesity related diagnoses. Amy Freeman is on the Category 1 Plan and states she is following her eating plan approximately 80% of the time. Amy Freeman states she is walking for 30 minutes 2 times per week.  Today's visit was #: 4 Starting weight: 260 lbs Starting date: 01/11/2020 Today's weight: 244 lbs Today's date: 02/26/2020 Total lbs lost to date: 16 lbs Total lbs lost since last OV: 0  Interim History: "I was not as good as I should have been."  She says she has been a little depressed as her sister's husband passed of [place] recently.  She has been stressed as her sister is stressed.  She says she walked more when she felt stressed and she is planning to increase.  Not weighing proteins at dinnertime and she thinks she is not getting enough proteins in.  She has not been eating lunches either over the past couple of weeks.  She has only been eating an apple for lunch some days.  For snacks, she has [place] breast.  Assessment/Plan:   1. Tobacco abuse She has been taking Chantix for 2-3 months now, per her PCP.  No quit date currently, but tried several times.  Her husband smokes in the house as well.  Plan:  Start low dose Wellbutrin 100 mg twice daily, as per below.  Risks and benefits discussed with her.  Go to [facility] and/or [email or web address] and set up coaching for smoking cessation.  Also, ask for support materials.  At next office visit, have a quit date set!  Meds ordered this encounter  Medications  . buPROPion (WELLBUTRIN SR) 100 MG 12 hr tablet    Sig: Take 1 tablet (100 mg total) by mouth 2 (two) times daily.    Dispense:  60 tablet    Refill:  0    2. Prediabetes She says that her dad had diabetes and was unhealthy.  She tells me today that she worries about herself becoming one.    Lab Results  Component Value Date   HGBA1C 6.1 (H) 01/11/2020   Lab Results    Component Value Date   INSULIN 14.0 01/11/2020   Plan:  Reminded her of the importance of following the meal plan and minimizing off-plan eating.  Increase protein and decrease simple carbs.  Check A1c in 3 months and as needed.  3. Depression, unspecified depression type- emotional eating/ cravings Increased stress lately.  Tolerated Wellbutrin in the past.  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  "I don't need that."  Plan:  Start Wellbutrin SR 100 mg twice daily , as per below.  Declines counseling, increase exercise.  Risks and benefits of medications discussed with her.  -Start buPROPion (WELLBUTRIN SR) 100 MG 12 hr tablet; Take 1 tablet (100 mg total) by mouth 2 (two) times daily.  Dispense: 60 tablet; Refill: 0  4. Class 3 severe obesity with serious comorbidity and body mass index (BMI) of 45.0 to 49.9 in adult, unspecified obesity type Amy Freeman)  Amy Freeman is currently in the action stage of change. As such, her goal is to continue with weight loss efforts. She has agreed to the Category 1 Plan.   Exercise goals: Walk for 30 minutes 3-5 days per week for stress management.  Behavioral modification strategies: increasing lean protein intake, decreasing simple carbohydrates, meal planning and cooking strategies and planning for  success.  Amy Freeman has agreed to follow-up with our clinic in 2 weeks. She was informed of the importance of frequent follow-up visits to maximize her success with intensive lifestyle modifications for her multiple health conditions.   Objective:   Blood pressure 122/73, pulse 76, temperature 98.6 F (37 C), height 5' (1.524 m), weight 244 lb (110.7 kg), SpO2 96 %. Body mass index is 47.65 kg/m.  General: Cooperative, alert, well developed, in no acute distress. HEENT: Conjunctivae and lids unremarkable. Cardiovascular: Regular rhythm.  Lungs: Normal work of breathing. Neurologic: No focal deficits.   Lab Results  Component Value Date    CREATININE 0.8 12/14/2019   NA 138 12/14/2019   K 4.5 12/14/2019   CL 104 12/14/2019   Lab Results  Component Value Date   ALT 12 12/14/2019   AST 16 12/14/2019   ALKPHOS 103 12/14/2019   Lab Results  Component Value Date   HGBA1C 6.1 (H) 01/11/2020   Lab Results  Component Value Date   INSULIN 14.0 01/11/2020   Lab Results  Component Value Date   TSH 2.10 12/14/2019   Lab Results  Component Value Date   CHOL 181 12/14/2019   HDL 43 12/14/2019   LDLCALC 111 12/14/2019   TRIG 153 12/14/2019   Lab Results  Component Value Date   WBC 6.8 12/14/2019   HGB 14.5 12/14/2019   HCT 43 12/14/2019   PLT 281 12/14/2019   Obesity Behavioral Intervention:   Approximately 15 minutes were spent on the discussion below.  ASK: We discussed the diagnosis of obesity with Amy Freeman today and Amy Freeman agreed to give [place] permission to discuss obesity behavioral modification therapy today.  ASSESS: Amy Freeman has the diagnosis of obesity and her BMI today is 47.7. Amy Freeman is in the action stage of change.   ADVISE: Amy Freeman was educated on the multiple health risks of obesity as well as the benefit of weight loss to improve her health. She was advised of the need for long term treatment and the importance of lifestyle modifications to improve her current health and to decrease her risk of future health problems.  AGREE: Multiple dietary modification options and treatment options were discussed and Amy Freeman agreed to follow the recommendations documented in the above note.  ARRANGE: Amy Freeman was educated on the importance of frequent visits to treat obesity as outlined per CMS and USPSTF guidelines and agreed to schedule her next follow up appointment today.  Attestation Statements:   Reviewed by clinician on day of visit: allergies, medications, problem list, medical history, surgical history, family history, social history, and previous encounter notes.  I, Water quality scientist, CMA, am acting as  Location manager for [employer], DO.  I have reviewed the above documentation for accuracy and completeness, and I agree with the above. Amy Dance, DO

## 2020-02-28 DIAGNOSIS — G5602 Carpal tunnel syndrome, left upper limb: Secondary | ICD-10-CM | POA: Diagnosis not present

## 2020-02-28 DIAGNOSIS — Z136 Encounter for screening for cardiovascular disorders: Secondary | ICD-10-CM | POA: Diagnosis not present

## 2020-02-28 DIAGNOSIS — Z01818 Encounter for other preprocedural examination: Secondary | ICD-10-CM | POA: Diagnosis not present

## 2020-02-29 DIAGNOSIS — G5602 Carpal tunnel syndrome, left upper limb: Secondary | ICD-10-CM | POA: Diagnosis not present

## 2020-02-29 HISTORY — PX: OTHER SURGICAL HISTORY: SHX169

## 2020-03-04 ENCOUNTER — Other Ambulatory Visit: Payer: Self-pay

## 2020-03-04 ENCOUNTER — Ambulatory Visit (AMBULATORY_SURGERY_CENTER): Payer: Medicare HMO | Admitting: Gastroenterology

## 2020-03-04 ENCOUNTER — Encounter: Payer: Self-pay | Admitting: Gastroenterology

## 2020-03-04 VITALS — BP 125/63 | HR 66 | Temp 98.0°F | Resp 12 | Ht 60.0 in | Wt 250.6 lb

## 2020-03-04 DIAGNOSIS — D122 Benign neoplasm of ascending colon: Secondary | ICD-10-CM

## 2020-03-04 DIAGNOSIS — D123 Benign neoplasm of transverse colon: Secondary | ICD-10-CM | POA: Diagnosis not present

## 2020-03-04 DIAGNOSIS — Z1211 Encounter for screening for malignant neoplasm of colon: Secondary | ICD-10-CM | POA: Diagnosis not present

## 2020-03-04 MED ORDER — SODIUM CHLORIDE 0.9 % IV SOLN: 500.0000 mL | INTRAVENOUS | Status: DC

## 2020-03-04 NOTE — Progress Notes (Addendum)
[place]  Pt's states no medical or surgical changes since previsit or office visit.   Pt had left hand carpal tunnel surgery 02-29-20.  B/P cuff on pt's RT lower leg.  Recovery and anesthesia aware.  maw

## 2020-03-04 NOTE — Patient Instructions (Signed)
Handouts given for diverticulosis, hemorrhoids and polyps.  YOU HAD AN ENDOSCOPIC PROCEDURE TODAY AT [facility]:   Refer to the procedure report that was given to you for any specific questions about what was found during the examination.  If the procedure report does not answer your questions, please call your gastroenterologist to clarify.  If you requested that your care partner not be given the details of your procedure findings, then the procedure report has been included in a sealed envelope for you to review at your convenience later.  YOU SHOULD EXPECT: Some feelings of bloating in the abdomen. Passage of more gas than usual.  Walking can help get rid of the air that was put into your GI tract during the procedure and reduce the bloating. If you had a lower endoscopy (such as a colonoscopy or flexible sigmoidoscopy) you may notice spotting of blood in your stool or on the toilet paper. If you underwent a bowel prep for your procedure, you may not have a normal bowel movement for a few days.  Please Note:  You might notice some irritation and congestion in your nose or some drainage.  This is from the oxygen used during your procedure.  There is no need for concern and it should clear up in a day or so.  SYMPTOMS TO REPORT IMMEDIATELY:   Following lower endoscopy (colonoscopy or flexible sigmoidoscopy):  Excessive amounts of blood in the stool  Significant tenderness or worsening of abdominal pains  Swelling of the abdomen that is new, acute  Fever of 100F or higher  For urgent or emergent issues, a gastroenterologist can be reached at any hour by calling [phone]. Do not use MyChart messaging for urgent concerns.    DIET:  We do recommend a small meal at first, but then you may proceed to your regular diet.  Drink plenty of fluids but you should avoid alcoholic beverages for 24 hours.  ACTIVITY:  You should plan to take it easy for the rest of today and you  should NOT DRIVE or use heavy machinery until tomorrow (because of the sedation medicines used during the test).    FOLLOW UP: Our staff will call the number listed on your records 48-72 hours following your procedure to check on you and address any questions or concerns that you may have regarding the information given to you following your procedure. If we do not reach you, we will leave a message.  We will attempt to reach you two times.  During this call, we will ask if you have developed any symptoms of COVID 19. If you develop any symptoms (ie: fever, flu-like symptoms, shortness of breath, cough etc.) before then, please call [phone].  If you test positive for Covid 19 in the 2 weeks post procedure, please call and report this information to [place].    If any biopsies were taken you will be contacted by phone or by letter within the next 1-3 weeks.  Please call us at [phone] if you have not heard about the biopsies in 3 weeks.    SIGNATURES/CONFIDENTIALITY: You and/or your care partner have signed paperwork which will be entered into your electronic medical record.  These signatures attest to the fact that that the information above on your After Visit Summary has been reviewed and is understood.  Full responsibility of the confidentiality of this discharge information lies with you and/or your care-partner.

## 2020-03-04 NOTE — Progress Notes (Signed)
To PACU, VSS. Report to [email or web address] 

## 2020-03-04 NOTE — Op Note (Signed)
[place] Patient Name: Amy Freeman Procedure Date: 03/04/2020 11:33 AM MRN: [id number] Endoscopist: Mauri Pole , MD Age: 69 Referring MD:  Date of Birth: 12-Nov-1950 Gender: Female Account #: [id number] Procedure:                Colonoscopy Indications:              Screening for colorectal malignant neoplasm Medicines:                Monitored Anesthesia Care Procedure:                Pre-Anesthesia Assessment:                           - Prior to the procedure, a History and Physical                            was performed, and patient medications and                            allergies were reviewed. The patient's tolerance of                            previous anesthesia was also reviewed. The risks                            and benefits of the procedure and the sedation                            options and risks were discussed with the patient.                            All questions were answered, and informed consent                            was obtained. Prior Anticoagulants: The patient has                            taken no previous anticoagulant or antiplatelet                            agents. ASA Grade Assessment: II - A patient with                            mild systemic disease. After reviewing the risks                            and benefits, the patient was deemed in                            satisfactory condition to undergo the procedure.                           After obtaining informed consent, the colonoscope  was passed under direct vision. Throughout the                            procedure, the patient's blood pressure, pulse, and                            oxygen saturations were monitored continuously. The                            Colonoscope was introduced through the anus and                            advanced to the the cecum, identified by                            appendiceal orifice  and ileocecal valve. The                            colonoscopy was performed without difficulty. The                            patient tolerated the procedure well. The quality                            of the bowel preparation was good. The ileocecal                            valve, appendiceal orifice, and rectum were                            photographed. Scope In: 11:43:54 AM Scope Out: 11:57:25 AM Scope Withdrawal Time: 0 hours 8 minutes 57 seconds  Total Procedure Duration: 0 hours 13 minutes 31 seconds  Findings:                 The perianal and digital rectal examinations were                            normal.                           Three sessile polyps were found in the transverse                            colon and ascending colon. The polyps were 5 to 10                            mm in size. These polyps were removed with a cold                            snare. Resection and retrieval were complete.                           A few small-mouthed diverticula were found in the  sigmoid colon.                           Non-bleeding internal hemorrhoids were found during                            retroflexion. The hemorrhoids were small. Complications:            No immediate complications. Estimated Blood Loss:     Estimated blood loss was minimal. Impression:               - Three 5 to 10 mm polyps in the transverse colon                            and in the ascending colon, removed with a cold                            snare. Resected and retrieved.                           - Diverticulosis in the sigmoid colon.                           - Non-bleeding internal hemorrhoids. Recommendation:           - Patient has a contact number available for                            emergencies. The signs and symptoms of potential                            delayed complications were discussed with the                            patient. Return to  normal activities tomorrow.                            Written discharge instructions were provided to the                            patient.                           - Resume previous diet.                           - Continue present medications.                           - Await pathology results.                           - Repeat colonoscopy in 3 - 5 years for                            surveillance based on pathology results. Mauri Pole, MD 03/04/2020 12:03:08 PM This report has been signed electronically.

## 2020-03-04 NOTE — Progress Notes (Signed)
Called to room to assist during endoscopic procedure.  Patient ID and intended procedure confirmed with present staff. Received instructions for my participation in the procedure from the performing physician.  

## 2020-03-06 ENCOUNTER — Telehealth: Payer: Self-pay | Admitting: *Deleted

## 2020-03-06 NOTE — Telephone Encounter (Signed)
  Follow up Call-  Call back number 03/04/2020  Post procedure Call Back phone  # [phone] HM  Permission to leave phone message Yes  Some recent data might be hidden     Patient questions:  Do you have a fever, pain , or abdominal swelling? No. Pain Score  0 *  Have you tolerated food without any problems? Yes.    Have you been able to return to your normal activities? Yes.    Do you have any questions about your discharge instructions: Diet   No. Medications  No. Follow up visit  No.  Do you have questions or concerns about your Care? No.  Actions: * If pain score is 4 or above: 1. No action needed, pain <4.Have you developed a fever since your procedure? no  2.   Have you had an respiratory symptoms (SOB or cough) since your procedure? no  3.   Have you tested positive for COVID 19 since your procedure no  4.   Have you had any family members/close contacts diagnosed with the COVID 19 since your procedure?  no   If yes to any of these questions please route to Joylene John, RN and Joella Prince, RN

## 2020-03-12 ENCOUNTER — Encounter: Payer: Self-pay | Admitting: Gastroenterology

## 2020-03-13 ENCOUNTER — Encounter (INDEPENDENT_AMBULATORY_CARE_PROVIDER_SITE_OTHER): Payer: Self-pay | Admitting: Family Medicine

## 2020-03-13 ENCOUNTER — Ambulatory Visit (INDEPENDENT_AMBULATORY_CARE_PROVIDER_SITE_OTHER): Payer: Medicare HMO | Admitting: Family Medicine

## 2020-03-13 ENCOUNTER — Other Ambulatory Visit: Payer: Self-pay

## 2020-03-13 VITALS — BP 100/60 | HR 91 | Temp 97.8°F | Ht 61.0 in | Wt 238.0 lb

## 2020-03-13 DIAGNOSIS — F3289 Other specified depressive episodes: Secondary | ICD-10-CM

## 2020-03-13 DIAGNOSIS — Z6841 Body Mass Index (BMI) 40.0 and over, adult: Secondary | ICD-10-CM | POA: Diagnosis not present

## 2020-03-13 DIAGNOSIS — Z720 Tobacco use: Secondary | ICD-10-CM | POA: Diagnosis not present

## 2020-03-13 MED ORDER — BUPROPION HCL ER (SR) 100 MG PO TB12: 100.0000 mg | ORAL_TABLET | Freq: Two times a day (BID) | ORAL | 0 refills | Status: DC

## 2020-03-19 NOTE — Progress Notes (Signed)
Chief Complaint:   OBESITY Amy Freeman is here to discuss her progress with her obesity treatment plan along with follow-up of her obesity related diagnoses. Amy Freeman is on the Category 1 Plan and states she is following her eating plan approximately 60% of the time. Amy Freeman states she is exercising for 0 minutes 0 times per week.  Today's visit was #: 5 Starting weight: 260 lbs Starting date: 01/11/2020 Today's weight: 238 lbs Today's date: 03/13/2020 Total lbs lost to date: 22 lbs Total lbs lost since last in-office visit: 6 lbs Total weight loss percentage to date: -8.46%  Interim History: Amy Freeman had a colonoscopy within the last couple of weeks and had a tooth pulled.  She was not able to eat much.  For breakfast, she had yogurt only.  For lunch, wraps with meat and swiss cheese with 100-calorie chip snack pack.  On her birthday, [birth year], she had a cupcake with ice cream.  Today, she is down 22 pounds total.  Assessment/Plan:   1. Tobacco abuse Started Wellbutrin at last office visit.  She was supposed to have a quit date set, but does not yet.  Denies medication side effects.  She says the Wellbutrin is helping with cravings.  Plan:  Quit date is March 23, 2020.  Continue Wellbutrin.  -Refill buPROPion (WELLBUTRIN SR) 100 MG 12 hr tablet; Take 1 tablet (100 mg total) by mouth 2 (two) times daily.  Dispense: 60 tablet; Refill: 0  2. Other depression, with emotional eating Denies SI.  She is taking Wellbutrin 100 mg daily.  No side effects.  Plan:  Continue Wellbutrin.  Continue prudent nutritional plan and weight loss.  Continue to gradually increase exercise, which will help to improve mood.  -Refill buPROPion (WELLBUTRIN SR) 100 MG 12 hr tablet; Take 1 tablet (100 mg total) by mouth 2 (two) times daily.  Dispense: 60 tablet; Refill: 0  3. Class 3 severe obesity with serious comorbidity and body mass index (BMI) of 45.0 to 49.9 in adult, unspecified obesity type [facility])  Amy Freeman is  currently in the action stage of change. As such, her goal is to continue with weight loss efforts. She has agreed to the Category 1 Plan.   Exercise goals: Older adults should follow the adult guidelines. When older adults cannot meet the adult guidelines, they should be as physically active as their abilities and conditions will allow.  Older adults should do exercises that maintain or improve balance if they are at risk of falling.   Behavioral modification strategies: increasing lean protein intake, no skipping meals, keeping healthy foods in the home and planning for success.  Amy Freeman has agreed to follow-up with our clinic in 2 weeks. She was informed of the importance of frequent follow-up visits to maximize her success with intensive lifestyle modifications for her multiple health conditions.   Objective:   Blood pressure 100/60, pulse 91, temperature 97.8 F (36.6 C), height 5\' 1"  (1.549 m), weight 238 lb (108 kg), SpO2 97 %. Body mass index is 44.97 kg/m.  General: Cooperative, alert, well developed, in no acute distress. HEENT: Conjunctivae and lids unremarkable. Cardiovascular: Regular rhythm.  Lungs: Normal work of breathing. Neurologic: No focal deficits.   Lab Results  Component Value Date   CREATININE 0.8 12/14/2019   NA 138 12/14/2019   K 4.5 12/14/2019   CL 104 12/14/2019   Lab Results  Component Value Date   ALT 12 12/14/2019   AST 16 12/14/2019   ALKPHOS 103 12/14/2019  Lab Results  Component Value Date   HGBA1C 6.1 (H) 01/11/2020   Lab Results  Component Value Date   INSULIN 14.0 01/11/2020   Lab Results  Component Value Date   TSH 2.10 12/14/2019   Lab Results  Component Value Date   CHOL 181 12/14/2019   HDL 43 12/14/2019   LDLCALC 111 12/14/2019   TRIG 153 12/14/2019   Lab Results  Component Value Date   WBC 6.8 12/14/2019   HGB 14.5 12/14/2019   HCT 43 12/14/2019   PLT 281 12/14/2019   Obesity Behavioral Intervention:    Approximately 15 minutes were spent on the discussion below.  ASK: We discussed the diagnosis of obesity with Amy Freeman today and Amy Freeman agreed to give [place] permission to discuss obesity behavioral modification therapy today.  ASSESS: Amy Freeman has the diagnosis of obesity and her BMI today is 45.1. Amy Freeman is in the action stage of change.   ADVISE: Amy Freeman was educated on the multiple health risks of obesity as well as the benefit of weight loss to improve her health. She was advised of the need for long term treatment and the importance of lifestyle modifications to improve her current health and to decrease her risk of future health problems.  AGREE: Multiple dietary modification options and treatment options were discussed and Amy Freeman agreed to follow the recommendations documented in the above note.  ARRANGE: Amy Freeman was educated on the importance of frequent visits to treat obesity as outlined per CMS and USPSTF guidelines and agreed to schedule her next follow up appointment today.  Attestation Statements:   Reviewed by clinician on day of visit: allergies, medications, problem list, medical history, surgical history, family history, social history, and previous encounter notes.  I, Water quality scientist, CMA, am acting as Location manager for [employer], DO.  I have reviewed the above documentation for accuracy and completeness, and I agree with the above. Marjory Sneddon, D.O.  The [place] was signed into law in 2016 which includes the topic of electronic health records.  This provides immediate access to information in MyChart.  This includes consultation notes, operative notes, office notes, lab results and pathology reports.  If you have any questions about what you read please let us know at your next visit so we can discuss your concerns and take corrective action if need be.  We are right here with you.

## 2020-03-25 DIAGNOSIS — M79641 Pain in right hand: Secondary | ICD-10-CM | POA: Diagnosis not present

## 2020-04-01 ENCOUNTER — Other Ambulatory Visit: Payer: Self-pay

## 2020-04-01 ENCOUNTER — Ambulatory Visit (INDEPENDENT_AMBULATORY_CARE_PROVIDER_SITE_OTHER): Payer: Medicare HMO | Admitting: Family Medicine

## 2020-04-01 ENCOUNTER — Encounter (INDEPENDENT_AMBULATORY_CARE_PROVIDER_SITE_OTHER): Payer: Self-pay | Admitting: Family Medicine

## 2020-04-01 VITALS — BP 96/59 | HR 81 | Temp 97.8°F | Ht 61.0 in | Wt 234.0 lb

## 2020-04-01 DIAGNOSIS — F3289 Other specified depressive episodes: Secondary | ICD-10-CM

## 2020-04-01 DIAGNOSIS — Z9189 Other specified personal risk factors, not elsewhere classified: Secondary | ICD-10-CM | POA: Diagnosis not present

## 2020-04-01 DIAGNOSIS — Z720 Tobacco use: Secondary | ICD-10-CM | POA: Diagnosis not present

## 2020-04-01 DIAGNOSIS — Z6841 Body Mass Index (BMI) 40.0 and over, adult: Secondary | ICD-10-CM | POA: Diagnosis not present

## 2020-04-01 MED ORDER — BUPROPION HCL ER (SR) 100 MG PO TB12: 100.0000 mg | ORAL_TABLET | Freq: Two times a day (BID) | ORAL | 0 refills | Status: DC

## 2020-04-03 NOTE — Progress Notes (Signed)
Chief Complaint:   OBESITY Amy Freeman is here to discuss her progress with her obesity treatment plan along with follow-up of her obesity related diagnoses. Amy Freeman is on the Category 1 Plan and states she is following her eating plan approximately 50% of the time. Amy Freeman states she is walking for 30 minutes 3 times per week.  Today's visit was #: 6 Starting weight: 260 lbs Starting date: 01/11/2020 Today's weight: 234 lbs Today's date: 04/01/2020 Total lbs lost to date: 26 lbs Total lbs lost since last in-office visit: 4 lbs Total weight loss percentage to date: -10.00%  Interim History:  - Amy Freeman says that for the first 2 weeks, she ate everything on plan and she lost 12 pounds.   - For the past 2 weeks, she was not hungry and skipped at least 1 meal per day and at other meals she did not eat all her food.   - She has right hand pain due to carpal tunnel.  She has having surgery on 04/04/2020 for that.   -  She says that Wellbutrin, that we started her on 02-26-20 for depression and EE along with smoking cessation, is helping with cravings and it is decreasing her appetite.    Assessment/Plan:   1. Tobacco abuse We started her on Wellbutrin 1 month ago.  She reports that she quit smoking on October 30.  Plan:  Continue smoking cessation.  Encouraged to create new habit and change from prior so she will not be triggered.  Recommend husband only smoke outside and not in front of her.    2. Other depression, with emotional eating Tolerating Wellbutrin well.  Denies side effects.  Her cravings are much improved but not sure how it is helping mood since the increased pain in her right hand is driving her nuts.  She thinks this pain is negatively effecting her mood.   She says she needs a refill.  No difference with sleep / no insomnia or other concerns.  Plan:  Refill Wellbutrin, as per below.  -Refill buPROPion (WELLBUTRIN SR) 100 MG 12 hr tablet; Take 1 tablet (100 mg total) by mouth 2  (two) times daily.  Dispense: 60 tablet; Refill: 0    3. At risk for side effect of medication Amy Freeman was given approximately 10 minutes of drug side effect counseling today.  We discussed side effect possibility and risk versus benefits. Amy Freeman agreed to the medication and will contact this office if these side effects are intolerable.    4. Class 3 severe obesity with serious comorbidity and body mass index (BMI) of 40.0 to 44.9 in adult, unspecified obesity type [facility])  Amy Freeman is currently in the action stage of change. As such, her goal is to continue with weight loss efforts. She has agreed to the Category 1 Plan.   Exercise goals: Increase walking to 4-5 days per week if possible.  Behavioral modification strategies: no skipping meals, meal planning and cooking strategies, keeping healthy foods in the home and planning for success.  Amy Freeman has agreed to follow-up with our clinic in 2 weeks.    - She was informed of the importance of frequent follow-up visits to maximize her success with intensive lifestyle modifications for her multiple health conditions.     Objective:   Blood pressure (!) 96/59, pulse 81, temperature 97.8 F (36.6 C), height 5\' 1"  (1.549 m), weight 234 lb (106.1 kg), SpO2 96 %. Body mass index is 44.21 kg/m.  General: Cooperative, alert, well developed,  in no acute distress. HEENT: Conjunctivae and lids unremarkable. Cardiovascular: Regular rhythm.  Lungs: Normal work of breathing. Neurologic: No focal deficits.   Lab Results  Component Value Date   CREATININE 0.8 12/14/2019   NA 138 12/14/2019   K 4.5 12/14/2019   CL 104 12/14/2019   Lab Results  Component Value Date   ALT 12 12/14/2019   AST 16 12/14/2019   ALKPHOS 103 12/14/2019   Lab Results  Component Value Date   HGBA1C 6.1 (H) 01/11/2020   Lab Results  Component Value Date   INSULIN 14.0 01/11/2020   Lab Results  Component Value Date   TSH 2.10 12/14/2019   Lab Results    Component Value Date   CHOL 181 12/14/2019   HDL 43 12/14/2019   LDLCALC 111 12/14/2019   TRIG 153 12/14/2019   Lab Results  Component Value Date   WBC 6.8 12/14/2019   HGB 14.5 12/14/2019   HCT 43 12/14/2019   PLT 281 12/14/2019   Attestation Statements:   Reviewed by clinician on day of visit: allergies, medications, problem list, medical history, surgical history, family history, social history, and previous encounter notes.  I, Water quality scientist, CMA, am acting as Location manager for [employer], DO.  I have reviewed the above documentation for accuracy and completeness, and I agree with the above. Marjory Sneddon, D.O.  The [place] was signed into law in 2016 which includes the topic of electronic health records.  This provides immediate access to information in MyChart.  This includes consultation notes, operative notes, office notes, lab results and pathology reports.  If you have any questions about what you read please let us know at your next visit so we can discuss your concerns and take corrective action if need be.  We are right here with you.

## 2020-04-04 DIAGNOSIS — G5601 Carpal tunnel syndrome, right upper limb: Secondary | ICD-10-CM | POA: Diagnosis not present

## 2020-04-06 NOTE — Patient Instructions (Signed)
The 10-year ASCVD risk score Amy Freeman., et al., 2013) is: 4.6%*   Values used to calculate the score:     Age: 69 years     Sex: Female     Is Non-Hispanic African American: No     Diabetic: No     Tobacco smoker: No     Systolic Blood Pressure: 96 mmHg     Is BP treated: No     HDL Cholesterol: 43 mg/dL*     Total Cholesterol: 181 mg/dL*     * - Cholesterol units were assumed for this score calculation

## 2020-04-15 DIAGNOSIS — G5601 Carpal tunnel syndrome, right upper limb: Secondary | ICD-10-CM | POA: Diagnosis not present

## 2020-04-17 ENCOUNTER — Ambulatory Visit (INDEPENDENT_AMBULATORY_CARE_PROVIDER_SITE_OTHER): Payer: Medicare HMO | Admitting: Family Medicine

## 2020-04-17 ENCOUNTER — Encounter (INDEPENDENT_AMBULATORY_CARE_PROVIDER_SITE_OTHER): Payer: Self-pay | Admitting: Family Medicine

## 2020-04-17 ENCOUNTER — Other Ambulatory Visit: Payer: Self-pay

## 2020-04-17 VITALS — BP 136/81 | HR 77 | Temp 97.7°F | Ht 61.0 in | Wt 230.0 lb

## 2020-04-17 DIAGNOSIS — F3289 Other specified depressive episodes: Secondary | ICD-10-CM

## 2020-04-17 DIAGNOSIS — Z720 Tobacco use: Secondary | ICD-10-CM

## 2020-04-17 DIAGNOSIS — Z6841 Body Mass Index (BMI) 40.0 and over, adult: Secondary | ICD-10-CM

## 2020-04-17 DIAGNOSIS — E559 Vitamin D deficiency, unspecified: Secondary | ICD-10-CM | POA: Insufficient documentation

## 2020-04-17 DIAGNOSIS — E7849 Other hyperlipidemia: Secondary | ICD-10-CM

## 2020-04-17 DIAGNOSIS — R7303 Prediabetes: Secondary | ICD-10-CM | POA: Diagnosis not present

## 2020-04-17 DIAGNOSIS — Z9189 Other specified personal risk factors, not elsewhere classified: Secondary | ICD-10-CM | POA: Insufficient documentation

## 2020-04-17 MED ORDER — VITAMIN D (ERGOCALCIFEROL) 1.25 MG (50000 UNIT) PO CAPS: 50000.0000 [IU] | ORAL_CAPSULE | ORAL | 0 refills | Status: DC

## 2020-04-17 MED ORDER — BUPROPION HCL ER (SR) 100 MG PO TB12: 100.0000 mg | ORAL_TABLET | Freq: Two times a day (BID) | ORAL | 0 refills | Status: DC

## 2020-04-18 LAB — LIPID PANEL
Chol/HDL Ratio: 4.6 ratio — ABNORMAL HIGH (ref 0.0–4.4)
Cholesterol, Total: 193 mg/dL (ref 100–199)
HDL: 42 mg/dL (ref >39)
LDL Chol Calc (NIH): 126 mg/dL — ABNORMAL HIGH (ref 0–99)
Triglycerides: 138 mg/dL (ref 0–149)
VLDL Cholesterol Cal: 25 mg/dL (ref 5–40)

## 2020-04-18 LAB — VITAMIN D 25 HYDROXY (VIT D DEFICIENCY, FRACTURES): Vit D, 25-Hydroxy: 35 ng/mL (ref 30.0–100.0)

## 2020-04-18 LAB — HEMOGLOBIN A1C
Est. average glucose Bld gHb Est-mCnc: 126 mg/dL
Hgb A1c MFr Bld: 6 % — ABNORMAL HIGH (ref 4.8–5.6)

## 2020-04-18 LAB — INSULIN, RANDOM: INSULIN: 12.2 u[IU]/mL (ref 2.6–24.9)

## 2020-04-22 DIAGNOSIS — M25632 Stiffness of left wrist, not elsewhere classified: Secondary | ICD-10-CM | POA: Diagnosis not present

## 2020-04-22 DIAGNOSIS — M6281 Muscle weakness (generalized): Secondary | ICD-10-CM | POA: Diagnosis not present

## 2020-04-22 DIAGNOSIS — G5601 Carpal tunnel syndrome, right upper limb: Secondary | ICD-10-CM | POA: Diagnosis not present

## 2020-04-22 DIAGNOSIS — M25631 Stiffness of right wrist, not elsewhere classified: Secondary | ICD-10-CM | POA: Diagnosis not present

## 2020-04-22 NOTE — Progress Notes (Signed)
Chief Complaint:   OBESITY Amy Freeman is here to discuss her progress with her obesity treatment plan along with follow-up of her obesity related diagnoses. Amy Freeman is on the Category 1 Plan and states she is following her eating plan approximately 30% of the time. Amy Freeman states she is walking for 30 minutes 2 times per week.  Today's visit was #: 7 Starting weight: 260 lbs Starting date: 01/11/2020 Today's weight: 230 lbs Today's date: 04/17/2020 Total lbs lost to date: 30 lbs Total lbs lost since last in-office visit: 4 lbs Total weight loss percentage to date: -11.54%  Interim History: [name] says, "I haven't been eating with any regularity.  Just skipping meals".  For breakfast, she has 1 yogurt. Lunch, she skips most of the time because she is not hungry and eats breakfast so late.  For dinner, she only gets in 1.5 to 2 ounces of protein at night with 1 cup of vegetables or eats a frozen meal.  Plan:  No skipping meals/foods!  Assessment/Plan:   1. Tobacco abuse Back to smoking 2-3 cigarettes per day.  She had previously quit on October 30.  Plan:  Refill Wellbutrin SR 100 mg twice daily.  Advised smoking cessation, and we discussed behavioral modification to help her succeed.  2. Prediabetes [name] has a diagnosis of prediabetes based on her elevated HgA1c and was informed this puts her at greater risk of developing diabetes. She continues to work on diet and exercise to decrease her risk of diabetes. She denies nausea or hypoglycemia.  She is not on medication for this condition.  Plan:  Check A1c and FI today.  Lab Results  Component Value Date   HGBA1C 6.0 (H) 04/17/2020   Lab Results  Component Value Date   INSULIN 12.2 04/17/2020   INSULIN 14.0 01/11/2020   - Insulin, random - Hemoglobin A1c  3. Other hyperlipidemia with hypertriglyceridemia Amy Freeman has hyperlipidemia and has been trying to improve her cholesterol levels with intensive lifestyle modification including  a low saturated fat diet, exercise and weight loss. She denies any chest pain, claudication or myalgias.  This is diet controlled.  No medication.  Plan:  Check FLP today.  Lab Results  Component Value Date   ALT 12 12/14/2019   AST 16 12/14/2019   ALKPHOS 103 12/14/2019   Lab Results  Component Value Date   CHOL 193 04/17/2020   HDL 42 04/17/2020   LDLCALC 126 (H) 04/17/2020   TRIG 138 04/17/2020   CHOLHDL 4.6 (H) 04/17/2020   - Lipid panel  4. Vitamin D deficiency Amy Freeman's Vitamin D level was 15.4 on 12/14/2019. She is currently taking no vitamin D supplement.  She has not taken any vitamin D since late October because she had previously gotten it from her PCP (once from our office) and she was not aware she needed to continue taking it.  Plan:  Check vitamin D level today but no supplement for 5 weeks or so.  Refill vitamin D, but she was told to continue this medications until we tell her otherwise.  She will get refills at office visits with [place] in the future.  -Refill Vitamin D, Ergocalciferol, (DRISDOL) 1.25 MG (50000 UNIT) CAPS capsule; Take 1 capsule (50,000 Units total) by mouth every 7 (seven) days.  Dispense: 4 capsule; Refill: 0. - VITAMIN D 25 Hydroxy (Vit-D Deficiency, Fractures)  5. Other depression, with emotional eating Wellbutrin is helping with emotional eating as well.  Not sure why - just does not  have cravings and she uses strategies to re-divert her direction.  Plan:  Refill Wellbutrin, as per below.  -Refill buPROPion (WELLBUTRIN SR) 100 MG 12 hr tablet; Take 1 tablet (100 mg total) by mouth 2 (two) times daily.  Dispense: 60 tablet; Refill: 0  6. Class 3 severe obesity with serious comorbidity and body mass index (BMI) of 40.0 to 44.9 in adult, unspecified obesity type Amy Freeman)  Amy Freeman is currently in the action stage of change. As such, her goal is to continue with weight loss efforts. She has agreed to the Category 1 Plan.   Exercise goals: Increase as  tolerated.  Behavioral modification strategies: increasing lean protein intake, decreasing simple carbohydrates, no skipping meals, travel eating strategies and planning for success.  Amy Freeman has agreed to follow-up with our clinic in 2-3 weeks. She was informed of the importance of frequent follow-up visits to maximize her success with intensive lifestyle modifications for her multiple health conditions.   Objective:   Blood pressure 136/81, pulse 77, temperature 97.7 F (36.5 C), height 5\' 1"  (1.549 m), weight 230 lb (104.3 kg), SpO2 96 %. Body mass index is 43.46 kg/m.  General: Cooperative, alert, well developed, in no acute distress. HEENT: Conjunctivae and lids unremarkable. Cardiovascular: Regular rhythm.  Lungs: Normal work of breathing. Neurologic: No focal deficits.   Lab Results  Component Value Date   CREATININE 0.8 12/14/2019   NA 138 12/14/2019   K 4.5 12/14/2019   CL 104 12/14/2019   Lab Results  Component Value Date   ALT 12 12/14/2019   AST 16 12/14/2019   ALKPHOS 103 12/14/2019   Lab Results  Component Value Date   HGBA1C 6.0 (H) 04/17/2020   HGBA1C 6.1 (H) 01/11/2020   Lab Results  Component Value Date   INSULIN 12.2 04/17/2020   INSULIN 14.0 01/11/2020   Lab Results  Component Value Date   TSH 2.10 12/14/2019   Lab Results  Component Value Date   CHOL 193 04/17/2020   HDL 42 04/17/2020   LDLCALC 126 (H) 04/17/2020   TRIG 138 04/17/2020   CHOLHDL 4.6 (H) 04/17/2020   Lab Results  Component Value Date   WBC 6.8 12/14/2019   HGB 14.5 12/14/2019   HCT 43 12/14/2019   PLT 281 12/14/2019   Obesity Behavioral Intervention:   Approximately 15 minutes were spent on the discussion below.  ASK: We discussed the diagnosis of obesity with Amy Freeman today and Amy Freeman agreed to give [place] permission to discuss obesity behavioral modification therapy today.  ASSESS: Amy Freeman has the diagnosis of obesity and her BMI today is 43.5. Amy Freeman is in the action stage  of change.   ADVISE: Amy Freeman was educated on the multiple health risks of obesity as well as the benefit of weight loss to improve her health. She was advised of the need for long term treatment and the importance of lifestyle modifications to improve her current health and to decrease her risk of future health problems.  AGREE: Multiple dietary modification options and treatment options were discussed and Kirsi agreed to follow the recommendations documented in the above note.  ARRANGE: Alka was educated on the importance of frequent visits to treat obesity as outlined per CMS and USPSTF guidelines and agreed to schedule her next follow up appointment today.  Attestation Statements:   Reviewed by clinician on day of visit: allergies, medications, problem list, medical history, surgical history, family history, social history, and previous encounter notes.  I, Water quality scientist, CMA, am acting as Location manager  for [employer], DO.  I have reviewed the above documentation for accuracy and completeness, and I agree with the above. Marjory Sneddon, D.O.  The [place] was signed into law in 2016 which includes the topic of electronic health records.  This provides immediate access to information in MyChart.  This includes consultation notes, operative notes, office notes, lab results and pathology reports.  If you have any questions about what you read please let us know at your next visit so we can discuss your concerns and take corrective action if need be.  We are right here with you.

## 2020-05-01 ENCOUNTER — Other Ambulatory Visit: Payer: Self-pay

## 2020-05-01 ENCOUNTER — Encounter (INDEPENDENT_AMBULATORY_CARE_PROVIDER_SITE_OTHER): Payer: Self-pay | Admitting: Family Medicine

## 2020-05-01 ENCOUNTER — Ambulatory Visit (INDEPENDENT_AMBULATORY_CARE_PROVIDER_SITE_OTHER): Payer: Medicare HMO | Admitting: Family Medicine

## 2020-05-01 VITALS — BP 112/71 | HR 76 | Temp 97.5°F | Ht 61.0 in | Wt 227.0 lb

## 2020-05-01 DIAGNOSIS — E7849 Other hyperlipidemia: Secondary | ICD-10-CM | POA: Diagnosis not present

## 2020-05-01 DIAGNOSIS — M25631 Stiffness of right wrist, not elsewhere classified: Secondary | ICD-10-CM | POA: Diagnosis not present

## 2020-05-01 DIAGNOSIS — E559 Vitamin D deficiency, unspecified: Secondary | ICD-10-CM | POA: Diagnosis not present

## 2020-05-01 DIAGNOSIS — G5601 Carpal tunnel syndrome, right upper limb: Secondary | ICD-10-CM | POA: Diagnosis not present

## 2020-05-01 DIAGNOSIS — Z6841 Body Mass Index (BMI) 40.0 and over, adult: Secondary | ICD-10-CM | POA: Diagnosis not present

## 2020-05-01 DIAGNOSIS — F39 Unspecified mood [affective] disorder: Secondary | ICD-10-CM | POA: Diagnosis not present

## 2020-05-01 DIAGNOSIS — M6281 Muscle weakness (generalized): Secondary | ICD-10-CM | POA: Diagnosis not present

## 2020-05-01 DIAGNOSIS — R7303 Prediabetes: Secondary | ICD-10-CM | POA: Diagnosis not present

## 2020-05-01 DIAGNOSIS — M25632 Stiffness of left wrist, not elsewhere classified: Secondary | ICD-10-CM | POA: Diagnosis not present

## 2020-05-01 DIAGNOSIS — Z9189 Other specified personal risk factors, not elsewhere classified: Secondary | ICD-10-CM

## 2020-05-01 MED ORDER — VITAMIN D (ERGOCALCIFEROL) 1.25 MG (50000 UNIT) PO CAPS: ORAL_CAPSULE | ORAL | 0 refills | Status: DC

## 2020-05-01 MED ORDER — BUPROPION HCL ER (SR) 100 MG PO TB12: 100.0000 mg | ORAL_TABLET | Freq: Two times a day (BID) | ORAL | 0 refills | Status: DC

## 2020-05-01 NOTE — Progress Notes (Signed)
Chief Complaint:   OBESITY Caidynce is here to discuss her progress with her obesity treatment plan along with follow-up of her obesity related diagnoses. Taja is on the Category 1 Plan and states she is following her eating plan approximately 70% of the time. Havanah states she is walking 45 minutes 2 times per week.  Today's visit was #: 8 Starting weight: 260 lbs Starting date: 01/11/2020 Today's weight: 227 lbs Today's date: 05/07/2020 Total lbs lost to date: 33 lbs Total lbs lost since last in-office visit: 3 lbs Total weight loss percentage to date: -12.69%  Interim History: Renny reports that she ate "regular foods for Thanksgiving." The other days she pretty much followed the meal plan. Her husband is very unhealthy and she is worried about him. She wants to cook healthier for him and herself. Taiylor is still not eating all proteins/foods on plan and eats more carbohydrates than what's on plan.  Assessment/Plan:   1. Pre-diabetes Discussed labs with patient today.  Wakeelah has a diagnosis of prediabetes based on her elevated HgA1c and was informed this puts her at greater risk of developing diabetes. She continues to work on diet and exercise to decrease her risk of diabetes. She denies nausea or hypoglycemia.  Lab Results  Component Value Date   HGBA1C 6.0 (H) 04/17/2020   Lab Results  Component Value Date   INSULIN 12.2 04/17/2020   INSULIN 14.0 01/11/2020   Plan: Cierah has had minimal improvements with A1c and fasting insulin. Decrease simple carbohydrates and eat all foods on plan. Continue weight loss. We will ocnsider GLP-1- ie Wegovy- in the next couple office visits.  2. Vitamin D deficiency Discussed labs with patient today. Wafa's Vitamin D level was 35 on 04/17/2020. She is currently taking prescription vitamin D 50,000 IU each week. She denies nausea, vomiting or muscle weakness.  Plan: Increase Vit D to twice weekly. New prescription provided.  Increased  Dose- Vitamin D, Ergocalciferol, (DRISDOL) 1.25 MG (50000 UNIT) CAPS capsule; 1 tab every wed and 1 tab every sun  Dispense: 8 capsule; Refill: 0  3. Other hyperlipidemia Worsening. Discussed labs with patient today. Increases LDL from prior, but Emily declines statins.  The 10-year ASCVD risk score Mikey Bussing DC Brooke Bonito., et al., 2013) is: 6.3%   Values used to calculate the score:     Age: 30 years     Sex: Female     Is Non-Hispanic African American: No     Diabetic: No     Tobacco smoker: No     Systolic Blood Pressure: 330 mmHg     Is BP treated: No     HDL Cholesterol: 42 mg/dL     Total Cholesterol: 193 mg/dL  Plan: Decrease saturated and trans fats. Stormi declines medication and wants to work on diet and exercise.  4. Mood disorder ([place]), with emotional eating Ares is prescribed Wellbutrin. She is struggling with emotional eating and using food for comfort to the extent that it is negatively impacting her health. She has been working on behavior modification techniques to help reduce her emotional eating. She shows no sign of suicidal or homicidal ideations.  Plan: Refill Wellbutrin 100 mg for 1 month, as per below.  Refill- buPROPion (WELLBUTRIN SR) 100 MG 12 hr tablet; Take 1 tablet (100 mg total) by mouth 2 (two) times daily.  Dispense: 60 tablet; Refill: 0  5. Class 3 severe obesity with serious comorbidity and body mass index (BMI) of 40.0 to 44.9 in  adult, unspecified obesity type [facility]) Ryelynn is currently in the action stage of change. As such, her goal is to continue with weight loss efforts. She has agreed to the Category 1 Plan.   Exercise goals: Older adults should follow the adult guidelines. When older adults cannot meet the adult guidelines, they should be as physically active as their abilities and conditions will allow.   Behavioral modification strategies: increasing lean protein intake, decreasing simple carbohydrates, no skipping meals, holiday eating strategies  and  planning for success.  Laylynn has agreed to follow-up with our clinic in 2-4 weeks. She was informed of the importance of frequent follow-up visits to maximize her success with intensive lifestyle modifications for her multiple health conditions.   Objective:   Blood pressure 112/71, pulse 76, temperature (!) 97.5 F (36.4 C), height 5\' 1"  (1.549 m), weight 227 lb (103 kg), SpO2 97 %. Body mass index is 42.89 kg/m.  General: Cooperative, alert, well developed, in no acute distress. HEENT: Conjunctivae and lids unremarkable. Cardiovascular: Regular rhythm.  Lungs: Normal work of breathing. Neurologic: No focal deficits.   Lab Results  Component Value Date   CREATININE 0.8 12/14/2019   NA 138 12/14/2019   K 4.5 12/14/2019   CL 104 12/14/2019   Lab Results  Component Value Date   ALT 12 12/14/2019   AST 16 12/14/2019   ALKPHOS 103 12/14/2019   Lab Results  Component Value Date   HGBA1C 6.0 (H) 04/17/2020   HGBA1C 6.1 (H) 01/11/2020   Lab Results  Component Value Date   INSULIN 12.2 04/17/2020   INSULIN 14.0 01/11/2020   Lab Results  Component Value Date   TSH 2.10 12/14/2019   Lab Results  Component Value Date   CHOL 193 04/17/2020   HDL 42 04/17/2020   LDLCALC 126 (H) 04/17/2020   TRIG 138 04/17/2020   CHOLHDL 4.6 (H) 04/17/2020   Lab Results  Component Value Date   WBC 6.8 12/14/2019   HGB 14.5 12/14/2019   HCT 43 12/14/2019   PLT 281 12/14/2019   Obesity Behavioral Intervention:   Approximately 15 minutes were spent on the discussion below.  ASK: We discussed the diagnosis of obesity with Karenna today and Kathia agreed to give [place] permission to discuss obesity behavioral modification therapy today.  ASSESS: Analee has the diagnosis of obesity and her BMI today is 43.0. [place] is in the action stage of change.   ADVISE: Yesly was educated on the multiple health risks of obesity as well as the benefit of weight loss to improve her health. She was  advised of the need for long term treatment and the importance of lifestyle modifications to improve her current health and to decrease her risk of future health problems.  AGREE: Multiple dietary modification options and treatment options were discussed and Lahna agreed to follow the recommendations documented in the above note.  ARRANGE: Brennan was educated on the importance of frequent visits to treat obesity as outlined per CMS and USPSTF guidelines and agreed to schedule her next follow up appointment today.  Attestation Statements:   Reviewed by clinician on day of visit: allergies, medications, problem list, medical history, surgical history, family history, social history, and previous encounter notes.  Coral Ceo, am acting as Location manager for [employer], DO.  I have reviewed the above documentation for accuracy and completeness, and I agree with the above. Marjory Sneddon, D.O.  The 21st Century Cures Act was signed into law in 2016  which includes the topic of electronic health records.  This provides immediate access to information in MyChart.  This includes consultation notes, operative notes, office notes, lab results and pathology reports.  If you have any questions about what you read please let us know at your next visit so we can discuss your concerns and take corrective action if need be.  We are right here with you.

## 2020-05-01 NOTE — Patient Instructions (Addendum)
The 10-year ASCVD risk score Mikey Bussing DC Brooke Bonito., et al., 2013) is: 6.3%   Values used to calculate the score:     Age: 69 years     Sex: Female     Is Non-Hispanic African American: No     Diabetic: No     Tobacco smoker: No     Systolic Blood Pressure: 712 mmHg     Is BP treated: No     HDL Cholesterol: 42 mg/dL     Total Cholesterol: 193 mg/dL

## 2020-05-09 DIAGNOSIS — M25631 Stiffness of right wrist, not elsewhere classified: Secondary | ICD-10-CM | POA: Diagnosis not present

## 2020-05-09 DIAGNOSIS — G5601 Carpal tunnel syndrome, right upper limb: Secondary | ICD-10-CM | POA: Diagnosis not present

## 2020-05-09 DIAGNOSIS — M6281 Muscle weakness (generalized): Secondary | ICD-10-CM | POA: Diagnosis not present

## 2020-05-09 DIAGNOSIS — M25632 Stiffness of left wrist, not elsewhere classified: Secondary | ICD-10-CM | POA: Diagnosis not present

## 2020-05-13 DIAGNOSIS — M6281 Muscle weakness (generalized): Secondary | ICD-10-CM | POA: Diagnosis not present

## 2020-05-13 DIAGNOSIS — G5601 Carpal tunnel syndrome, right upper limb: Secondary | ICD-10-CM | POA: Diagnosis not present

## 2020-05-13 DIAGNOSIS — M25632 Stiffness of left wrist, not elsewhere classified: Secondary | ICD-10-CM | POA: Diagnosis not present

## 2020-05-13 DIAGNOSIS — M25631 Stiffness of right wrist, not elsewhere classified: Secondary | ICD-10-CM | POA: Diagnosis not present

## 2020-05-27 ENCOUNTER — Other Ambulatory Visit: Payer: Self-pay

## 2020-05-27 ENCOUNTER — Telehealth (INDEPENDENT_AMBULATORY_CARE_PROVIDER_SITE_OTHER): Payer: Medicare HMO | Admitting: Family Medicine

## 2020-05-27 ENCOUNTER — Encounter (INDEPENDENT_AMBULATORY_CARE_PROVIDER_SITE_OTHER): Payer: Self-pay | Admitting: Family Medicine

## 2020-05-27 ENCOUNTER — Telehealth (INDEPENDENT_AMBULATORY_CARE_PROVIDER_SITE_OTHER): Payer: Self-pay

## 2020-05-27 VITALS — Ht 61.0 in

## 2020-05-27 DIAGNOSIS — F39 Unspecified mood [affective] disorder: Secondary | ICD-10-CM | POA: Diagnosis not present

## 2020-05-27 DIAGNOSIS — F17200 Nicotine dependence, unspecified, uncomplicated: Secondary | ICD-10-CM

## 2020-05-27 DIAGNOSIS — Z6841 Body Mass Index (BMI) 40.0 and over, adult: Secondary | ICD-10-CM

## 2020-05-27 DIAGNOSIS — E559 Vitamin D deficiency, unspecified: Secondary | ICD-10-CM

## 2020-05-27 MED ORDER — BUPROPION HCL ER (SR) 100 MG PO TB12: 100.0000 mg | ORAL_TABLET | Freq: Two times a day (BID) | ORAL | 0 refills | Status: DC

## 2020-05-27 MED ORDER — BUPROPION HCL ER (SR) 200 MG PO TB12: 200.0000 mg | ORAL_TABLET | Freq: Two times a day (BID) | ORAL | 0 refills | Status: DC

## 2020-05-27 MED ORDER — VITAMIN D (ERGOCALCIFEROL) 1.25 MG (50000 UNIT) PO CAPS: ORAL_CAPSULE | ORAL | 0 refills | Status: DC

## 2020-05-27 NOTE — Telephone Encounter (Signed)
Call to [facility] pharmacy and spoke with the female pharmacist to cancel the Wellbutrin 100.

## 2020-05-28 NOTE — Progress Notes (Unsigned)
TeleHealth Visit:  Due to the COVID-19 pandemic, this visit was completed with telemedicine (audio/video) technology to reduce patient and provider exposure as well as to preserve personal protective equipment.   [place] has verbally consented to this TeleHealth visit. The patient is located at home, the provider is located at the [facility]. The participants in this visit include the listed provider and patient. The visit was conducted today via video.   Chief Complaint: OBESITY Trinika is here to discuss her progress with her obesity treatment plan along with follow-up of her obesity related diagnoses. Naesha is on the Category 1 Plan and states she is following her eating plan approximately 40% of the time. Setareh states she is walking 30 minutes 1-2 times per week.  Today's visit was #: 9 Starting weight: 260 lbs Starting date: 01/11/2020  Interim History: Anureet reports that she has not done well lately. She thinks she gained 3 pounds. She states her husband eats poorly, and he has been home from work for 1-2 weeks. She has eaten off the plan, but says she is back track as of today.    Assessment/Plan:   1. Vitamin D deficiency We increased Chelsye's to twice a week at her last OV. She is tolerating the change well without side effects. Vitamin D level was 35.0 on 04/17/2020. She is currently taking prescription vitamin D 50,000 IU each week. She denies nausea, vomiting or muscle weakness.  Ref. Range 04/17/2020 10:26  Vitamin D, 25-Hydroxy Latest Ref Range: 30.0 - 100.0 ng/mL 35.0   Plan: Refill Vit D at same doase for 1 month, as per below.  - Reiterated importance of vitamin D (as well as calcium) to their health and wellbeing.  - Reminded Serita Grammesiane E Muckey that weight loss will likely improve availability of vitamin D, thus encouraged her to continue with meal plan and their weight loss efforts to further improve this condition. - I recommend patient continue to  take weekly prescription vit D 50,000 IU - Informed patient this may be a lifelong thing, and she was encouraged to continue to take the medicine until told otherwise.   - we will need to monitor levels regularly (every 3-4 mo on average) to keep levels within normal limits.  - weight loss will likely improve availability of vitamin D, thus encouraged Marge to continue with meal plan and their weight loss efforts to further improve this condition - pt's questions and concerns regarding this condition addressed.  Refill- Vitamin D, Ergocalciferol, (DRISDOL) 1.25 MG (50000 UNIT) CAPS capsule; 1 tab every wed and 1 tab every sun  Dispense: 8 capsule; Refill: 0  2. Smoking Harlyn reports that she is at 2-3 cigarettes a day now. She was on Chantix for 4 months recently, but PCP stopped as patient completed course.  Plan: I recommend to patient, instead of going back on Chantix, we should try increasing her Wellbutrin dose (which will help with smoking cessation). In the future we will consider adding a GLP-1 since patient is now pre-diabetic. However, we will only make 1 medication change at a time, thus increasing Wellbutrin today.   3. Mood disorder ([facility]), with emotional eating Imoni is taking Wellbutrin and she is tolerating it well but thinks an increase in dose may help with her stress level and emotional eating. Ariela reports that she feels Chantix helped her more with hunger than this lower dose of Wellbutrin.   Plan: Refill Wellbutrin but increase to 200 mg BID (up  from 100 mg BID) for 1 month, as per below.   Refill with increased dose to help with smoking cessation- buPROPion (WELLBUTRIN SR) 200 MG 12 hr tablet; Take 1 tablet (200 mg total) by mouth 2 (two) times daily.  Dispense: 60 tablet; Refill: 0  4. Class 3 severe obesity with serious comorbidity and body mass index (BMI) of 40.0 to 44.9 in adult, unspecified obesity type [facility]) Shayley is currently in the action stage of change. As such,  her goal is to continue with weight loss efforts. She has agreed to the Category 1 Plan.   Exercise goals: Start exercise videos on YouTube 15-20 minutes 5 days or more a week.  Behavioral modification strategies: meal planning and cooking strategies, keeping healthy foods in the home, emotional eating strategies, avoiding temptations and planning for success.  Rosellen has agreed to follow-up with our clinic in 2 weeks. She was informed of the importance of frequent follow-up visits to maximize her success with intensive lifestyle modifications for her multiple health conditions.  Objective:   VITALS: Per patient if applicable, see vitals. GENERAL: Alert and in no acute distress. CARDIOPULMONARY: No increased WOB. Speaking in clear sentences.  PSYCH: Pleasant and cooperative. Speech normal rate and rhythm. Affect is appropriate. Insight and judgement are appropriate. Attention is focused, linear, and appropriate.  NEURO: Oriented as arrived to appointment on time with no prompting.   Lab Results  Component Value Date   CREATININE 0.8 12/14/2019   NA 138 12/14/2019   K 4.5 12/14/2019   CL 104 12/14/2019   Lab Results  Component Value Date   ALT 12 12/14/2019   AST 16 12/14/2019   ALKPHOS 103 12/14/2019   Lab Results  Component Value Date   HGBA1C 6.0 (H) 04/17/2020   HGBA1C 6.1 (H) 01/11/2020   Lab Results  Component Value Date   INSULIN 12.2 04/17/2020   INSULIN 14.0 01/11/2020   Lab Results  Component Value Date   TSH 2.10 12/14/2019   Lab Results  Component Value Date   CHOL 193 04/17/2020   HDL 42 04/17/2020   LDLCALC 126 (H) 04/17/2020   TRIG 138 04/17/2020   CHOLHDL 4.6 (H) 04/17/2020   Lab Results  Component Value Date   WBC 6.8 12/14/2019   HGB 14.5 12/14/2019   HCT 43 12/14/2019   PLT 281 12/14/2019   No results found for: IRON, TIBC, FERRITIN  Attestation Statements:   Reviewed by clinician on day of visit: allergies, medications, problem list,  medical history, surgical history, family history, social history, and previous encounter notes.  Edmund Hilda, am acting as Energy manager for [employer], DO.  I have reviewed the above documentation for accuracy and completeness, and I agree with the above. Carlye Grippe, D.O.  The 21st Century Cures Act was signed into law in 2016 which includes the topic of electronic health records.  This provides immediate access to information in MyChart.  This includes consultation notes, operative notes, office notes, lab results and pathology reports.  If you have any questions about what you read please let us know at your next visit so we can discuss your concerns and take corrective action if need be.  We are right here with you.

## 2020-05-29 ENCOUNTER — Telehealth (INDEPENDENT_AMBULATORY_CARE_PROVIDER_SITE_OTHER): Payer: Self-pay

## 2020-05-29 DIAGNOSIS — G5601 Carpal tunnel syndrome, right upper limb: Secondary | ICD-10-CM | POA: Diagnosis not present

## 2020-06-10 ENCOUNTER — Encounter (INDEPENDENT_AMBULATORY_CARE_PROVIDER_SITE_OTHER): Payer: Self-pay

## 2020-06-10 ENCOUNTER — Other Ambulatory Visit: Payer: Self-pay

## 2020-06-10 ENCOUNTER — Encounter (INDEPENDENT_AMBULATORY_CARE_PROVIDER_SITE_OTHER): Payer: Self-pay | Admitting: Family Medicine

## 2020-06-10 ENCOUNTER — Telehealth (INDEPENDENT_AMBULATORY_CARE_PROVIDER_SITE_OTHER): Payer: Medicare HMO | Admitting: Family Medicine

## 2020-06-10 DIAGNOSIS — Z9189 Other specified personal risk factors, not elsewhere classified: Secondary | ICD-10-CM

## 2020-06-10 DIAGNOSIS — F17200 Nicotine dependence, unspecified, uncomplicated: Secondary | ICD-10-CM

## 2020-06-10 DIAGNOSIS — Z6841 Body Mass Index (BMI) 40.0 and over, adult: Secondary | ICD-10-CM | POA: Diagnosis not present

## 2020-06-10 DIAGNOSIS — F39 Unspecified mood [affective] disorder: Secondary | ICD-10-CM

## 2020-06-10 DIAGNOSIS — R7303 Prediabetes: Secondary | ICD-10-CM | POA: Diagnosis not present

## 2020-06-10 MED ORDER — METFORMIN HCL 500 MG PO TABS: 500.0000 mg | ORAL_TABLET | Freq: Two times a day (BID) | ORAL | 3 refills | Status: DC

## 2020-06-10 MED ORDER — METFORMIN HCL 500 MG PO TABS: ORAL_TABLET | ORAL | 0 refills | Status: DC

## 2020-06-10 NOTE — Telephone Encounter (Signed)
Verbal consent obtained to conduct video visit, via telehealth 

## 2020-06-12 NOTE — Progress Notes (Signed)
TeleHealth Visit:  Due to the COVID-19 pandemic, this visit was completed with telemedicine (audio/video) technology to reduce patient and provider exposure as well as to preserve personal protective equipment.   Amy Freeman has verbally consented to this TeleHealth visit. The patient is located at home, the provider is located at the [facility] office. The participants in this visit include the listed provider and patient. The visit was conducted today via video.  Chief Complaint: OBESITY Amy Freeman is here to discuss her progress with her obesity treatment plan along with follow-up of her obesity related diagnoses. Amy Freeman is on the Category 1 Plan and states she is following her eating plan approximately 40% of the time. 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  Today's visit was #: 10 Starting weight: 260 lbs Starting date: 01/11/2020  Interim History: Amy Freeman's last OV with patient was via video on 05/27/2020. We increased her Wellbutrin at that time with no help. She is still struggling with stress and getting back on plan.  Assessment/Plan:   1. Smoking Amy Freeman is smoking 4-5 cigarette a day. She was on Chantix in the past by PCP and did not get in contact with them yet about restarting this medication for mood.   Plan: Contact PCP to see if they feel a refill is appropriate or not. Smoking cessation management pr PCP.   2. Pre-diabetes Amy Freeman is still with hunger and sweets cravings. She prefers oral vs injection. On 12/14/2019 her A1c was 6.0 and crt was 0.77.  Amy Freeman has a diagnosis of prediabetes based on her elevated HgA1c and was informed this puts her at greater risk of developing diabetes. She continues to work on diet and exercise to decrease her risk of diabetes. She denies nausea or hypoglycemia. Lab Results  Component Value Date   HGBA1C 6.0 (H) 04/17/2020   Lab Results  Component Value Date   INSULIN 12.2 04/17/2020    INSULIN 14.0 01/11/2020    Plan: Start Metformin 500 mg 1/2 tablet by mouth every morning, then increase to 1 tablet by mouth every morning after a week. Risk/benefits of medication discussed with patient.  3. Mood disorder (Amy Freeman), with emotional eating Increased dose of Wellbutrin at last OV. Patient report she is still struggling with emotional eating and no change in mood with increased dose. Patient has been to counselor before and didn't feel it helped her.   Plan: Patient declines referral for CBT. I highly recommend she discuss with PCP her mood and additional possible medications to help her.  4. At risk for diabetes mellitus Amy Freeman was given approximately 26 minutes of diabetes education and counseling today. We discussed intensive lifestyle modifications today with an emphasis on weight loss as well as increasing exercise and decreasing simple carbohydrates in her diet. We also reviewed medication options with an emphasis on risk versus benefit of those discussed.   5. Class 3 severe obesity with serious comorbidity and body mass index (BMI) of 40.0 to 44.9 in adult, unspecified obesity type [facility]) Amy Freeman is currently in the action stage of change. As such, her goal is to continue with weight loss efforts. She has agreed to the Category 1 Plan.   Exercise goals: For substantial health benefits, adults should do at least 150 minutes (2 hours and 30 minutes) a week of moderate-intensity, or 75 minutes (1 hour and 15 minutes) a week of vigorous-intensity aerobic physical activity, or an equivalent combination of moderate- and  vigorous-intensity aerobic activity. Aerobic activity should be performed in episodes of at least 10 minutes, and preferably, it should be spread throughout the week.  Behavioral modification strategies: decreasing simple carbohydrates, meal planning and cooking strategies, better snacking choices, emotional eating strategies, avoiding temptations and planning for  success.  Amy Freeman has agreed to follow-up with our clinic in 2 weeks on 06/26/2020 at 1140. She was informed of the importance of frequent follow-up visits to maximize her success with intensive lifestyle modifications for her multiple health conditions.  Objective:   VITALS: Per patient if applicable, see vitals. GENERAL: Alert and in no acute distress. CARDIOPULMONARY: No increased WOB. Speaking in clear sentences.  PSYCH: Pleasant and cooperative. Speech normal rate and rhythm. Affect is appropriate. Insight and judgement are appropriate. Attention is focused, linear, and appropriate.  NEURO: Oriented as arrived to appointment on time with no prompting.   Lab Results  Component Value Date   CREATININE 0.8 12/14/2019   NA 138 12/14/2019   K 4.5 12/14/2019   CL 104 12/14/2019   Lab Results  Component Value Date   ALT 12 12/14/2019   AST 16 12/14/2019   ALKPHOS 103 12/14/2019   Lab Results  Component Value Date   HGBA1C 6.0 (H) 04/17/2020   HGBA1C 6.1 (H) 01/11/2020   Lab Results  Component Value Date   INSULIN 12.2 04/17/2020   INSULIN 14.0 01/11/2020   Lab Results  Component Value Date   TSH 2.10 12/14/2019   Lab Results  Component Value Date   CHOL 193 04/17/2020   HDL 42 04/17/2020   LDLCALC 126 (H) 04/17/2020   TRIG 138 04/17/2020   CHOLHDL 4.6 (H) 04/17/2020   Lab Results  Component Value Date   WBC 6.8 12/14/2019   HGB 14.5 12/14/2019   HCT 43 12/14/2019   PLT 281 12/14/2019   No results found for: IRON, TIBC, FERRITIN  Attestation Statements:   Reviewed by clinician on day of visit: allergies, medications, problem list, medical history, surgical history, family history, social history, and previous encounter notes.  Coral Ceo, am acting as Location manager for [employer], DO.  I have reviewed the above documentation for accuracy and completeness, and I agree with the above. Marjory Sneddon, D.O.  The [place] was  signed into law in 2016 which includes the topic of electronic health records.  This provides immediate access to information in MyChart.  This includes consultation notes, operative notes, office notes, lab results and pathology reports.  If you have any questions about what you read please let us know at your next visit so we can discuss your concerns and take corrective action if need be.  We are right here with you.

## 2020-06-24 DIAGNOSIS — Z01818 Encounter for other preprocedural examination: Secondary | ICD-10-CM | POA: Diagnosis not present

## 2020-06-24 DIAGNOSIS — M79642 Pain in left hand: Secondary | ICD-10-CM | POA: Diagnosis not present

## 2020-06-24 DIAGNOSIS — K5900 Constipation, unspecified: Secondary | ICD-10-CM | POA: Diagnosis not present

## 2020-06-24 DIAGNOSIS — E559 Vitamin D deficiency, unspecified: Secondary | ICD-10-CM | POA: Diagnosis not present

## 2020-06-24 DIAGNOSIS — R030 Elevated blood-pressure reading, without diagnosis of hypertension: Secondary | ICD-10-CM | POA: Diagnosis not present

## 2020-06-24 DIAGNOSIS — Z136 Encounter for screening for cardiovascular disorders: Secondary | ICD-10-CM | POA: Diagnosis not present

## 2020-06-24 DIAGNOSIS — Z0189 Encounter for other specified special examinations: Secondary | ICD-10-CM | POA: Diagnosis not present

## 2020-06-24 DIAGNOSIS — Z716 Tobacco abuse counseling: Secondary | ICD-10-CM | POA: Diagnosis not present

## 2020-06-24 DIAGNOSIS — E782 Mixed hyperlipidemia: Secondary | ICD-10-CM | POA: Diagnosis not present

## 2020-06-26 ENCOUNTER — Other Ambulatory Visit: Payer: Self-pay

## 2020-06-26 ENCOUNTER — Ambulatory Visit (INDEPENDENT_AMBULATORY_CARE_PROVIDER_SITE_OTHER): Payer: Medicare HMO | Admitting: Family Medicine

## 2020-06-26 ENCOUNTER — Encounter (INDEPENDENT_AMBULATORY_CARE_PROVIDER_SITE_OTHER): Payer: Self-pay | Admitting: Family Medicine

## 2020-06-26 VITALS — BP 132/76 | HR 81 | Temp 98.1°F | Ht 61.0 in | Wt 224.0 lb

## 2020-06-26 DIAGNOSIS — F39 Unspecified mood [affective] disorder: Secondary | ICD-10-CM

## 2020-06-26 DIAGNOSIS — Z6841 Body Mass Index (BMI) 40.0 and over, adult: Secondary | ICD-10-CM

## 2020-06-26 DIAGNOSIS — R7303 Prediabetes: Secondary | ICD-10-CM | POA: Diagnosis not present

## 2020-06-26 MED ORDER — BUPROPION HCL ER (SR) 200 MG PO TB12: 200.0000 mg | ORAL_TABLET | Freq: Every day | ORAL | 0 refills | Status: DC

## 2020-06-28 DIAGNOSIS — G5603 Carpal tunnel syndrome, bilateral upper limbs: Secondary | ICD-10-CM | POA: Diagnosis not present

## 2020-06-28 DIAGNOSIS — F17200 Nicotine dependence, unspecified, uncomplicated: Secondary | ICD-10-CM | POA: Diagnosis not present

## 2020-06-28 DIAGNOSIS — M79642 Pain in left hand: Secondary | ICD-10-CM | POA: Diagnosis not present

## 2020-06-28 DIAGNOSIS — R030 Elevated blood-pressure reading, without diagnosis of hypertension: Secondary | ICD-10-CM | POA: Diagnosis not present

## 2020-06-28 DIAGNOSIS — E559 Vitamin D deficiency, unspecified: Secondary | ICD-10-CM | POA: Diagnosis not present

## 2020-06-28 DIAGNOSIS — E782 Mixed hyperlipidemia: Secondary | ICD-10-CM | POA: Diagnosis not present

## 2020-06-28 DIAGNOSIS — Z716 Tobacco abuse counseling: Secondary | ICD-10-CM | POA: Diagnosis not present

## 2020-06-28 DIAGNOSIS — K5900 Constipation, unspecified: Secondary | ICD-10-CM | POA: Diagnosis not present

## 2020-07-01 NOTE — Progress Notes (Signed)
Chief Complaint:   OBESITY Amy Freeman is here to discuss her progress with her obesity treatment plan along with follow-up of her obesity related diagnoses.   Today's visit was #: 11 Starting weight: 260 lbs Starting date: 01/11/2020 Today's weight: 224 lbs Today's date: 06/26/2020 Total lbs lost to date: 36 lbs Body mass index is 42.32 kg/m.  Total weight loss percentage to date: -13.85%  Interim History: Amy Freeman's last office visit was on 05/01/2020.  She says she feels that Chantix helps with hunger and cravings so much more than Wellbutrin and metformin.  Plan:  Handouts given on prediabetes and metformin use per her request, for the second time, after counseling done.  Nutrition Plan: Category 1 Plan for 40% of the time. Activity: Silver Social research officer, government / YouTube videos for 30-60 minutes 3 times per week.  Assessment/Plan:   No orders of the defined types were placed in this encounter.   Medications Discontinued During This Encounter  Medication Reason  . HYDROcodone-Acetaminophen 2.5-325 MG TABS   . APO-VARENICLINE 1 MG tablet   . metFORMIN (GLUCOPHAGE) 500 MG tablet   . buPROPion (WELLBUTRIN SR) 200 MG 12 hr tablet      Meds ordered this encounter  Medications  . buPROPion (WELLBUTRIN SR) 200 MG 12 hr tablet    Sig: Take 1 tablet (200 mg total) by mouth daily.    Dispense:  30 tablet    Refill:  0     1. Prediabetes Not at goal. Goal is HgbA1c < 5.7.  Medication: metformin 500 mg daily.  She says that metformin did not help with her cravings for sweets and was causing constipation.    Plan:  Discontinue metformin.  Consider GLP-1 in the future, which she would like to read about first and speak with medical insurance about coverage first.  She will continue to focus on protein-rich, low simple carbohydrate foods. We reviewed the importance of hydration, regular exercise for stress reduction, and restorative sleep.   Lab Results  Component Value Date   HGBA1C 6.0 (H)  04/17/2020   Lab Results  Component Value Date   INSULIN 12.2 04/17/2020   INSULIN 14.0 01/11/2020   2. Mood disorder (Amy Freeman), with emotional eating Not at goal. Medication: Wellbutrin SR 200 mg twice daily.  She says she does not feel that Wellbutrin helps.  She would like to wean off.  She sees her PCP in 3 days about going back on Chantix, which worked better for her emotionally.   Plan:  Decrease Wellbutrin SR to 1 tablet every morning.  Wean off.  She will discuss Chantix with her PCP, and further mood management will be per her PCP.   - Decrease buPROPion (WELLBUTRIN SR) 200 MG 12 hr tablet; Take 1 tablet (200 mg total) by mouth daily.  Dispense: 30 tablet; Refill: 0  3. Class 3 severe obesity with serious comorbidity and body mass index (BMI) of 40.0 to 44.9 in adult, unspecified obesity type Amy Freeman)  Course: Amy Freeman is currently in the action stage of change. As such, her goal is to continue with weight loss efforts.   Nutrition goals: She has agreed to the Category 1 Plan.   Exercise goals: For substantial health benefits, adults should do at least 150 minutes (2 hours and 30 minutes) a week of moderate-intensity, or 75 minutes (1 hour and 15 minutes) a week of vigorous-intensity aerobic physical activity, or an equivalent combination of moderate- and vigorous-intensity aerobic activity. Aerobic activity should be performed in episodes  of at least 10 minutes, and preferably, it should be spread throughout the week.  Behavioral modification strategies: increasing lean protein intake, decreasing simple carbohydrates, no skipping meals, meal planning and cooking strategies, keeping healthy foods in the home, emotional eating strategies, avoiding temptations and planning for success.  Amy Freeman has agreed to follow-up with our clinic in 2 weeks after discontinuing metformin and weaning Wellbutrin. She was informed of the importance of frequent follow-up visits to maximize her success with  intensive lifestyle modifications for her multiple health conditions.   Objective:   Blood pressure 132/76, pulse 81, temperature 98.1 F (36.7 C), height 5\' 1"  (1.549 m), weight 224 lb (101.6 kg), SpO2 99 %. Body mass index is 42.32 kg/m.  General: Cooperative, alert, well developed, in no acute distress. HEENT: Conjunctivae and lids unremarkable. Cardiovascular: Regular rhythm.  Lungs: Normal work of breathing. Neurologic: No focal deficits.   Lab Results  Component Value Date   CREATININE 0.8 12/14/2019   NA 138 12/14/2019   K 4.5 12/14/2019   CL 104 12/14/2019   Lab Results  Component Value Date   ALT 12 12/14/2019   AST 16 12/14/2019   ALKPHOS 103 12/14/2019   Lab Results  Component Value Date   HGBA1C 6.0 (H) 04/17/2020   HGBA1C 6.1 (H) 01/11/2020   Lab Results  Component Value Date   INSULIN 12.2 04/17/2020   INSULIN 14.0 01/11/2020   Lab Results  Component Value Date   TSH 2.10 12/14/2019   Lab Results  Component Value Date   CHOL 193 04/17/2020   HDL 42 04/17/2020   LDLCALC 126 (H) 04/17/2020   TRIG 138 04/17/2020   CHOLHDL 4.6 (H) 04/17/2020   Lab Results  Component Value Date   WBC 6.8 12/14/2019   HGB 14.5 12/14/2019   HCT 43 12/14/2019   PLT 281 12/14/2019   Obesity Behavioral Intervention:   Approximately 15 minutes were spent on the discussion below.  ASK: We discussed the diagnosis of obesity with Amy Freeman today and Amy Freeman agreed to give [place] permission to discuss obesity behavioral modification therapy today.  ASSESS: Amy Freeman has the diagnosis of obesity and her BMI today is 42.5. Amy Freeman is in the action stage of change.   ADVISE: Amy Freeman was educated on the multiple health risks of obesity as well as the benefit of weight loss to improve her health. She was advised of the need for long term treatment and the importance of lifestyle modifications to improve her current health and to decrease her risk of future health  problems.  AGREE: Multiple dietary modification options and treatment options were discussed and Amy Freeman agreed to follow the recommendations documented in the above note.  ARRANGE: Amy Freeman was educated on the importance of frequent visits to treat obesity as outlined per CMS and USPSTF guidelines and agreed to schedule her next follow up appointment today.  Attestation Statements:   Reviewed by clinician on day of visit: allergies, medications, problem list, medical history, surgical history, family history, social history, and previous encounter notes.  I, Water quality scientist, CMA, am acting as Location manager for [employer], DO.  I have reviewed the above documentation for accuracy and completeness, and I agree with the above. Marjory Sneddon, D.O.  The [place] was signed into law in 2016 which includes the topic of electronic health records.  This provides immediate access to information in MyChart.  This includes consultation notes, operative notes, office notes, lab results and pathology reports.  If you  have any questions about what you read please let us know at your next visit so we can discuss your concerns and take corrective action if need be.  We are right here with you.

## 2020-07-10 ENCOUNTER — Other Ambulatory Visit: Payer: Self-pay

## 2020-07-10 ENCOUNTER — Telehealth (INDEPENDENT_AMBULATORY_CARE_PROVIDER_SITE_OTHER): Payer: Medicare HMO | Admitting: Family Medicine

## 2020-07-10 ENCOUNTER — Encounter (INDEPENDENT_AMBULATORY_CARE_PROVIDER_SITE_OTHER): Payer: Self-pay | Admitting: Family Medicine

## 2020-07-10 VITALS — Wt 222.0 lb

## 2020-07-10 DIAGNOSIS — E559 Vitamin D deficiency, unspecified: Secondary | ICD-10-CM

## 2020-07-10 DIAGNOSIS — Z6841 Body Mass Index (BMI) 40.0 and over, adult: Secondary | ICD-10-CM | POA: Diagnosis not present

## 2020-07-10 DIAGNOSIS — R7303 Prediabetes: Secondary | ICD-10-CM | POA: Diagnosis not present

## 2020-07-10 DIAGNOSIS — F39 Unspecified mood [affective] disorder: Secondary | ICD-10-CM

## 2020-07-10 DIAGNOSIS — Z9189 Other specified personal risk factors, not elsewhere classified: Secondary | ICD-10-CM | POA: Insufficient documentation

## 2020-07-10 MED ORDER — BUPROPION HCL ER (SR) 100 MG PO TB12: ORAL_TABLET | ORAL | 0 refills | Status: DC

## 2020-07-10 MED ORDER — VITAMIN D (ERGOCALCIFEROL) 1.25 MG (50000 UNIT) PO CAPS: ORAL_CAPSULE | ORAL | 0 refills | Status: DC

## 2020-07-17 NOTE — Progress Notes (Signed)
TeleHealth Visit:  Due to the COVID-19 pandemic, this visit was completed with telemedicine (audio/video) technology to reduce patient and provider exposure as well as to preserve personal protective equipment.   [name] has verbally consented to this TeleHealth visit. The patient is located at home, the provider is located at the [facility] office. The participants in this visit include the listed provider and patient and. The visit was conducted today via [place].  OBESITY Amy Freeman is here to discuss her progress with her obesity treatment plan along with follow-up of her obesity related diagnoses.   Today's visit was #: 12 Starting weight: 260 lbs Starting date: 01/11/2020 Today's date: 07/10/2020  Interim History:  Amy Freeman says she has been under increased stress as her husband got in a wreck with his truck recently.  She states she cannot eat 3 meals or get all foods in in a day.  She says she is, "just not hungry".  Denies issues with the plan or the food, she says it is just hard to eat it all.  Not weighing proteins.  Current Meal Plan: the Category 1 Plan for 40-50% of the time.  Current Exercise Plan: YouTube videos for 45 minutes 3-4 times per week.  Assessment/Plan:   1. Vitamin D deficiency Not at goal. Current vitamin D is 35.0, tested on 04/17/2020. Optimal goal > 50 ng/dL.   Plan: Continue to take prescription Vitamin D @50 ,000 IU every week as prescribed.  Follow-up for routine testing of Vitamin D, at least 2-3 times per year to avoid over-replacement.  - Refill Vitamin D, Ergocalciferol, (DRISDOL) 1.25 MG (50000 UNIT) CAPS capsule; 1 tab every wed and 1 tab every sun  Dispense: 8 capsule; Refill: 0  2. Prediabetes Not at goal. Goal is HgbA1c < 5.7.  Medication: None.  A1c 5.6 with PCP on January 31st.  Prior to that, it was 6.0 on 04/17/2020.    Plan:  She will continue to focus on protein-rich, low simple carbohydrate foods. We reviewed the importance  of hydration, regular exercise for stress reduction, and restorative sleep.  Continue prudent nutritional plan and weight loss.  Increase exercise.  Lab Results  Component Value Date   HGBA1C 6.0 (H) 04/17/2020   Lab Results  Component Value Date   INSULIN 12.2 04/17/2020   INSULIN 14.0 01/11/2020   3. Mood disorder ([place]), with emotional eating Medication: Wellbutrin 200 mg daily.  Her PCP restarted her on Chantix about 1 week ago.  We weaned her Wellbutrin from twice to once daily.  Tolerating well and feels she can come off Wellbutrin.  Mood stable, she says.  Plan:  Continue Chantix per PCP.  Decrease Wellbutrin to 100 mg daily for 2 weeks, then every other day for 2 weeks, then off.  New script given today.  - Change and Refill buPROPion (WELLBUTRIN SR) 100 MG 12 hr tablet; Take 1 tablet (100 mg total) by mouth daily for 14 days, THEN 1 tablet (100 mg total) every other day for 14 days.  Dispense: 30 tablet; Refill: 0  4. Class 3 severe obesity with serious comorbidity and body mass index (BMI) of 45.0 to 49.9 in adult, unspecified obesity type [facility])  Course: Amy Freeman is currently in the action stage of change. As such, her goal is to continue with weight loss efforts.   Nutrition goals: She has agreed to the Category 1 Plan.   Exercise goals: As is.  Behavioral modification strategies: increasing lean protein intake, decreasing simple carbohydrates,  no skipping meals and planning for success.  Amy Freeman has agreed to follow-up with our clinic in 2 weeks. She was informed of the importance of frequent follow-up visits to maximize her success with intensive lifestyle modifications for her multiple health conditions.   Objective:   Weight 222 lb (100.7 kg). Body mass index is 41.95 kg/m.  General: Cooperative, alert, well developed, in no acute distress. HEENT: Conjunctivae and lids unremarkable. Cardiovascular: Regular rhythm.  Lungs: Normal work of breathing. Neurologic: No focal  deficits.   Lab Results  Component Value Date   CREATININE 0.8 12/14/2019   NA 138 12/14/2019   K 4.5 12/14/2019   CL 104 12/14/2019   Lab Results  Component Value Date   ALT 12 12/14/2019   AST 16 12/14/2019   ALKPHOS 103 12/14/2019   Lab Results  Component Value Date   HGBA1C 6.0 (H) 04/17/2020   HGBA1C 6.1 (H) 01/11/2020   Lab Results  Component Value Date   INSULIN 12.2 04/17/2020   INSULIN 14.0 01/11/2020   Lab Results  Component Value Date   TSH 2.10 12/14/2019   Lab Results  Component Value Date   CHOL 193 04/17/2020   HDL 42 04/17/2020   LDLCALC 126 (H) 04/17/2020   TRIG 138 04/17/2020   CHOLHDL 4.6 (H) 04/17/2020   Lab Results  Component Value Date   WBC 6.8 12/14/2019   HGB 14.5 12/14/2019   HCT 43 12/14/2019   PLT 281 12/14/2019   Attestation Statements:   Reviewed by clinician on day of visit: allergies, medications, problem list, medical history, surgical history, family history, social history, and previous encounter notes.  I, Water quality scientist, CMA, am acting as Location manager for [employer], DO.  I have reviewed the above documentation for accuracy and completeness, and I agree with the above. Amy Freeman, D.O.  The [place] was signed into law in 2016 which includes the topic of electronic health records.  This provides immediate access to information in MyChart.  This includes consultation notes, operative notes, office notes, lab results and pathology reports.  If you have any questions about what you read please let us know at your next visit so we can discuss your concerns and take corrective action if need be.  We are right here with you.

## 2020-07-20 ENCOUNTER — Other Ambulatory Visit (INDEPENDENT_AMBULATORY_CARE_PROVIDER_SITE_OTHER): Payer: Self-pay | Admitting: Family Medicine

## 2020-07-30 ENCOUNTER — Other Ambulatory Visit: Payer: Self-pay

## 2020-07-30 ENCOUNTER — Ambulatory Visit (INDEPENDENT_AMBULATORY_CARE_PROVIDER_SITE_OTHER): Payer: Medicare HMO | Admitting: Family Medicine

## 2020-07-30 ENCOUNTER — Encounter (INDEPENDENT_AMBULATORY_CARE_PROVIDER_SITE_OTHER): Payer: Self-pay | Admitting: Family Medicine

## 2020-07-30 VITALS — BP 115/49 | HR 66 | Temp 98.0°F | Ht 61.0 in | Wt 228.0 lb

## 2020-07-30 DIAGNOSIS — Z6841 Body Mass Index (BMI) 40.0 and over, adult: Secondary | ICD-10-CM | POA: Diagnosis not present

## 2020-07-30 DIAGNOSIS — F39 Unspecified mood [affective] disorder: Secondary | ICD-10-CM

## 2020-07-30 DIAGNOSIS — E559 Vitamin D deficiency, unspecified: Secondary | ICD-10-CM

## 2020-07-30 DIAGNOSIS — Z9189 Other specified personal risk factors, not elsewhere classified: Secondary | ICD-10-CM | POA: Insufficient documentation

## 2020-07-30 MED ORDER — VITAMIN D (ERGOCALCIFEROL) 1.25 MG (50000 UNIT) PO CAPS: ORAL_CAPSULE | ORAL | 0 refills | Status: DC

## 2020-08-01 NOTE — Progress Notes (Signed)
Chief Complaint:   OBESITY Amy Freeman is here to discuss her progress with her obesity treatment plan along with follow-up of her obesity related diagnoses.   Today's visit was #: 13 Starting weight: 260 lbs Starting date: 01/11/2020 Today's weight: 228 lbs Today's date: 07/30/2020 Total lbs lost to date: 32 lbs Body mass index is 43.08 kg/m.  Total weight loss percentage to date: -12.31%  Interim History:  Amy Freeman's husband was injured at work, and this has put more stress on her.  She says she has not been able to follow the plan at all over the past couple of weeks because, "I'm mad as hell" at the situation.  Finances are a concern, but she would like to continue with the plan and take better care of herself.  Current Meal Plan: the Category 1 Plan for 0% of the time.  Current Exercise Plan: YouTube videos for 45 minutes 1 time per week.  Assessment/Plan:   1. Vitamin D deficiency Not at goal. Current vitamin D is 35.0, tested on 04/17/2020. Optimal goal > 50 ng/dL.  She is taking vitamin D 50,000 IU weekly.  Plan: Continue to take prescription Vitamin D @50 ,000 IU every week as prescribed.  Follow-up for routine testing of Vitamin D, at least 2-3 times per year to avoid over-replacement.  - Refill Vitamin D, Ergocalciferol, (DRISDOL) 1.25 MG (50000 UNIT) CAPS capsule; 1 tab every wed and 1 tab every sun  Dispense: 8 capsule; Refill: 0  2. Mood disorder (Amy Freeman), with emotional eating Not at goal. Medication: Chantix 0.5 mg twice daily.  She has been under more stress with personal issues.  Denies an increase in emotional eating.  Mood is stable, she says.  Plan:  We discussed Wellbutrin as she feels weaning is going well and declines lower dose.  Continue with medications per PCP.  Increase exercise.  Behavior modification techniques were discussed today to help deal with emotional/non-hunger eating behaviors.  3. Class 3 severe obesity with serious comorbidity and body mass index  (BMI) of 40.0 to 44.9 in adult, unspecified obesity type Amy Freeman)  Course: Amy Freeman is currently in the action stage of change. As such, her goal is to continue with weight loss efforts.   Nutrition goals: She has agreed to the Category 1 Plan.   Exercise goals: Cardio for 45 minutes 3 days per week.  Behavioral modification strategies: decreasing eating out, meal planning and cooking strategies, emotional eating strategies and avoiding temptations.  Amy Freeman has agreed to follow-up with our clinic in 2.5-3 weeks. She was informed of the importance of frequent follow-up visits to maximize her success with intensive lifestyle modifications for her multiple health conditions.   Objective:   Blood pressure (!) 115/49, pulse 66, temperature 98 F (36.7 C), height 5\' 1"  (1.549 m), weight 228 lb (103.4 kg), SpO2 98 %. Body mass index is 43.08 kg/m.  General: Cooperative, alert, well developed, in no acute distress. HEENT: Conjunctivae and lids unremarkable. Cardiovascular: Regular rhythm.  Lungs: Normal work of breathing. Neurologic: No focal deficits.   Lab Results  Component Value Date   CREATININE 0.8 12/14/2019   NA 138 12/14/2019   K 4.5 12/14/2019   CL 104 12/14/2019   Lab Results  Component Value Date   ALT 12 12/14/2019   AST 16 12/14/2019   ALKPHOS 103 12/14/2019   Lab Results  Component Value Date   HGBA1C 6.0 (H) 04/17/2020   HGBA1C 6.1 (H) 01/11/2020   Lab Results  Component Value Date  INSULIN 12.2 04/17/2020   INSULIN 14.0 01/11/2020   Lab Results  Component Value Date   TSH 2.10 12/14/2019   Lab Results  Component Value Date   CHOL 193 04/17/2020   HDL 42 04/17/2020   LDLCALC 126 (H) 04/17/2020   TRIG 138 04/17/2020   CHOLHDL 4.6 (H) 04/17/2020   Lab Results  Component Value Date   WBC 6.8 12/14/2019   HGB 14.5 12/14/2019   HCT 43 12/14/2019   PLT 281 12/14/2019   Obesity Behavioral Intervention:   Approximately 15 minutes were spent on the  discussion below.  ASK: We discussed the diagnosis of obesity with Amy Freeman today and Amy Freeman agreed to give [place] permission to discuss obesity behavioral modification therapy today.  ASSESS: Amy Freeman has the diagnosis of obesity and her BMI today is 43.2. Amy Freeman is in the action stage of change.   ADVISE: Aiza was educated on the multiple health risks of obesity as well as the benefit of weight loss to improve her health. She was advised of the need for long term treatment and the importance of lifestyle modifications to improve her current health and to decrease her risk of future health problems.  AGREE: Multiple dietary modification options and treatment options were discussed and Kaniesha agreed to follow the recommendations documented in the above note.  ARRANGE: Zarina was educated on the importance of frequent visits to treat obesity as outlined per CMS and USPSTF guidelines and agreed to schedule her next follow up appointment today.  Attestation Statements:   Reviewed by clinician on day of visit: allergies, medications, problem list, medical history, surgical history, family history, social history, and previous encounter notes.  I, Amy Freeman, Amy Freeman, am acting as Location manager for [employer], Amy Freeman.  I have reviewed the above documentation for accuracy and completeness, and I agree with the above. Amy Freeman, D.O.  The [place] was signed into law in 2016 which includes the topic of electronic health records.  This provides immediate access to information in MyChart.  This includes consultation notes, operative notes, office notes, lab results and pathology reports.  If you have any questions about what you read please let us know at your next visit so we can discuss your concerns and take corrective action if need be.  We are right here with you.

## 2020-08-15 ENCOUNTER — Ambulatory Visit (INDEPENDENT_AMBULATORY_CARE_PROVIDER_SITE_OTHER): Payer: Medicare HMO | Admitting: Family Medicine

## 2020-08-15 ENCOUNTER — Other Ambulatory Visit: Payer: Self-pay

## 2020-08-15 ENCOUNTER — Encounter (INDEPENDENT_AMBULATORY_CARE_PROVIDER_SITE_OTHER): Payer: Self-pay | Admitting: Family Medicine

## 2020-08-15 VITALS — BP 133/61 | HR 72 | Temp 97.8°F | Ht 61.0 in | Wt 229.0 lb

## 2020-08-15 DIAGNOSIS — E559 Vitamin D deficiency, unspecified: Secondary | ICD-10-CM | POA: Diagnosis not present

## 2020-08-15 DIAGNOSIS — Z6841 Body Mass Index (BMI) 40.0 and over, adult: Secondary | ICD-10-CM | POA: Diagnosis not present

## 2020-08-15 MED ORDER — VITAMIN D (ERGOCALCIFEROL) 1.25 MG (50000 UNIT) PO CAPS: ORAL_CAPSULE | ORAL | 0 refills | Status: DC

## 2020-08-26 NOTE — Progress Notes (Signed)
Chief Complaint:   OBESITY Amy Freeman is here to discuss her progress with her obesity treatment plan along with follow-up of her obesity related diagnoses.   Today's visit was #: 14 Starting weight: 260 lbs Starting date: 01/11/2020 Today's weight: 229 lbs Today's date: 09/15/2020 Total lbs lost to date: 31 lbs Body mass index is 43.27 kg/m.  Total weight loss percentage to date: -11.92%  Interim History:  Amy Freeman is here for a follow up office visit and she is following the meal plan without concerns or issues.  Patient's meal and food recall appears to be accurate and consistent with what is on the plan.  When on plan, her hunger and cravings are well controlled.    Current Meal Plan: the Category 1 Plan for 10% of the time.  Current Exercise Plan: YouTube, Silver Sneakers for 45-60 minutes 3 times per week.  Assessment/Plan:   No orders of the defined types were placed in this encounter.   Medications Discontinued During This Encounter  Medication Reason  . buPROPion (WELLBUTRIN SR) 100 MG 12 hr tablet   . Vitamin D, Ergocalciferol, (DRISDOL) 1.25 MG (50000 UNIT) CAPS capsule Reorder     Meds ordered this encounter  Medications  . Vitamin D, Ergocalciferol, (DRISDOL) 1.25 MG (50000 UNIT) CAPS capsule    Sig: 1 tab every wed and 1 tab every sun (OV for RF)    Dispense:  8 capsule    Refill:  0    (OV for RF)     1. Vitamin D deficiency Not at goal. Current vitamin D is 35.0, tested on 04/17/2020. Optimal goal > 50 ng/dL.  She is taking vitamin D 50,000 IU twice weekly.  Plan: Continue to take prescription Vitamin D @50 ,000 IU twice weekly as prescribed.  Follow-up for routine testing of Vitamin D, at least 2-3 times per year to avoid over-replacement.  - Refill Vitamin D, Ergocalciferol, (DRISDOL) 1.25 MG (50000 UNIT) CAPS capsule; 1 tab every wed and 1 tab every sun (OV for RF)  Dispense: 8 capsule; Refill: 0  2. Obesity, current BMI 43.4  Course: Amy Freeman  is currently in the action stage of change. As such, her goal is to continue with weight loss efforts.   Nutrition goals: She has agreed to the Category 1 Plan or keeping a food journal and adhering to recommended goals of 1000-1100 calories and 80+ grams of protein daily.   Exercise goals: For substantial health benefits, adults should do at least 150 minutes (2 hours and 30 minutes) a week of moderate-intensity, or 75 minutes (1 hour and 15 minutes) a week of vigorous-intensity aerobic physical activity, or an equivalent combination of moderate- and vigorous-intensity aerobic activity. Aerobic activity should be performed in episodes of at least 10 minutes, and preferably, it should be spread throughout the week.  Behavioral modification strategies: meal planning and cooking strategies and planning for success.  Amy Freeman has agreed to follow-up with our clinic in 2 weeks. She was informed of the importance of frequent follow-up visits to maximize her success with intensive lifestyle modifications for her multiple health conditions.   Objective:   Blood pressure 133/61, pulse 72, temperature 97.8 F (36.6 C), height 5\' 1"  (1.549 m), weight 229 lb (103.9 kg), SpO2 96 %. Body mass index is 43.27 kg/m.  General: Cooperative, alert, well developed, in no acute distress. HEENT: Conjunctivae and lids unremarkable. Cardiovascular: Regular rhythm.  Lungs: Normal work of breathing. Neurologic: No focal deficits.   Lab Results  Component Value Date   CREATININE 0.8 12/14/2019   NA 138 12/14/2019   K 4.5 12/14/2019   CL 104 12/14/2019   Lab Results  Component Value Date   ALT 12 12/14/2019   AST 16 12/14/2019   ALKPHOS 103 12/14/2019   Lab Results  Component Value Date   HGBA1C 6.0 (H) 04/17/2020   HGBA1C 6.1 (H) 01/11/2020   Lab Results  Component Value Date   INSULIN 12.2 04/17/2020   INSULIN 14.0 01/11/2020   Lab Results  Component Value Date   TSH 2.10 12/14/2019   Lab  Results  Component Value Date   CHOL 193 04/17/2020   HDL 42 04/17/2020   LDLCALC 126 (H) 04/17/2020   TRIG 138 04/17/2020   CHOLHDL 4.6 (H) 04/17/2020   Lab Results  Component Value Date   WBC 6.8 12/14/2019   HGB 14.5 12/14/2019   HCT 43 12/14/2019   PLT 281 12/14/2019   Obesity Behavioral Intervention:   Approximately 15 minutes were spent on the discussion below.  ASK: We discussed the diagnosis of obesity with Amy Freeman today and Amy Freeman agreed to give [place] permission to discuss obesity behavioral modification therapy today.  ASSESS: Amy Freeman has the diagnosis of obesity and her BMI today is 43.4. Amy Freeman is in the action stage of change.   ADVISE: Amy Freeman was educated on the multiple health risks of obesity as well as the benefit of weight loss to improve her health. She was advised of the need for long term treatment and the importance of lifestyle modifications to improve her current health and to decrease her risk of future health problems.  AGREE: Multiple dietary modification options and treatment options were discussed and Amy Freeman agreed to follow the recommendations documented in the above note.  ARRANGE: Amy Freeman was educated on the importance of frequent visits to treat obesity as outlined per CMS and USPSTF guidelines and agreed to schedule her next follow up appointment today.  Attestation Statements:   Reviewed by clinician on day of visit: allergies, medications, problem list, medical history, surgical history, family history, social history, and previous encounter notes.  I, Water quality scientist, CMA, am acting as Location manager for [employer], DO.  I have reviewed the above documentation for accuracy and completeness, and I agree with the above. Marjory Sneddon, D.O.  The [place] was signed into law in 2016 which includes the topic of electronic health records.  This provides immediate access to information in MyChart.  This includes consultation notes,  operative notes, office notes, lab results and pathology reports.  If you have any questions about what you read please let us know at your next visit so we can discuss your concerns and take corrective action if need be.  We are right here with you.

## 2020-08-29 ENCOUNTER — Ambulatory Visit (INDEPENDENT_AMBULATORY_CARE_PROVIDER_SITE_OTHER): Payer: Medicare HMO | Admitting: Family Medicine

## 2020-09-17 ENCOUNTER — Ambulatory Visit (INDEPENDENT_AMBULATORY_CARE_PROVIDER_SITE_OTHER): Payer: Medicare HMO | Admitting: Family Medicine

## 2020-09-26 ENCOUNTER — Other Ambulatory Visit: Payer: Self-pay

## 2020-09-26 ENCOUNTER — Encounter (INDEPENDENT_AMBULATORY_CARE_PROVIDER_SITE_OTHER): Payer: Self-pay | Admitting: Family Medicine

## 2020-09-26 ENCOUNTER — Ambulatory Visit (INDEPENDENT_AMBULATORY_CARE_PROVIDER_SITE_OTHER): Payer: Medicare HMO | Admitting: Family Medicine

## 2020-09-26 VITALS — BP 105/74 | HR 70 | Temp 97.9°F | Ht 61.0 in | Wt 230.0 lb

## 2020-09-26 DIAGNOSIS — Z720 Tobacco use: Secondary | ICD-10-CM | POA: Diagnosis not present

## 2020-09-26 DIAGNOSIS — F39 Unspecified mood [affective] disorder: Secondary | ICD-10-CM

## 2020-09-26 DIAGNOSIS — Z6841 Body Mass Index (BMI) 40.0 and over, adult: Secondary | ICD-10-CM | POA: Diagnosis not present

## 2020-09-26 DIAGNOSIS — E559 Vitamin D deficiency, unspecified: Secondary | ICD-10-CM

## 2020-09-26 DIAGNOSIS — R7303 Prediabetes: Secondary | ICD-10-CM

## 2020-09-26 MED ORDER — VITAMIN D (ERGOCALCIFEROL) 1.25 MG (50000 UNIT) PO CAPS: ORAL_CAPSULE | ORAL | 0 refills | Status: AC

## 2020-10-07 NOTE — Progress Notes (Signed)
Chief Complaint:   OBESITY Amy Freeman is here to discuss her progress with her obesity treatment plan along with follow-up of her obesity related diagnoses.   Today's visit was #: 15 Starting weight: 260 lbs Starting date: 01/11/2020 Today's weight: 230 lbs Today's date: 09/26/2020 Weight change since last visit: +1 lb Total lbs lost to date: 30 lbs Body mass index is 43.46 kg/m.  Total weight loss percentage to date: -11.54%  Interim History:  Amy Freeman is not following the plan that well.  She eats a yogurt for breakfast only.  At lunch and dinner, "it's hit or miss on whether or not I eat on plan".  She still has increased stress in her life.  Plan:  She wishes to take a break as she has too many stressors in her life.  I recommend the Intuitive Eating workbook, discussing with her PCP possible changes to mood medications, and possible counseling as well.  Current Meal Plan: the Category 1 Plan or keeping a food journal and adhering to recommended goals of 1000-1100 calories and 80+ grams of protein for 30% of the time.  Current Exercise Plan: None but walking around the house/yard for 30-60 minutes 3 times per week.  Assessment/Plan:   No orders of the defined types were placed in this encounter.   Medications Discontinued During This Encounter  Medication Reason  . Vitamin D, Ergocalciferol, (DRISDOL) 1.25 MG (50000 UNIT) CAPS capsule Reorder     Meds ordered this encounter  Medications  . Vitamin D, Ergocalciferol, (DRISDOL) 1.25 MG (50000 UNIT) CAPS capsule    Sig: 1 tab every wed and 1 tab every sun (OV for RF)    Dispense:  8 capsule    Refill:  0    (OV for RF)    1. Prediabetes Not at goal. Goal is HgbA1c < 5.7.  Medication: None.  Recheck with PCP in January or February.  Plan:  She will continue to focus on protein-rich, low simple carbohydrate foods. We reviewed the importance of hydration, regular exercise for stress reduction, and restorative sleep.  Continue  prudent nutritional plan and weight loss.  She declined medications in the past repeatedly.  Lab Results  Component Value Date   HGBA1C 6.0 (H) 04/17/2020   Lab Results  Component Value Date   INSULIN 12.2 04/17/2020   INSULIN 14.0 01/11/2020   2. Vitamin D deficiency Not at goal. Current vitamin D is 35.0, tested on 04/17/2020. Optimal goal > 50 ng/dL.  She is taking vitamin D 50,000 IU weekly.  Plan: Continue to take prescription Vitamin D @50 ,000 IU every week as prescribed.  Follow-up for routine testing of Vitamin D, at least 2-3 times per year to avoid over-replacement.  - Refill Vitamin D, Ergocalciferol, (DRISDOL) 1.25 MG (50000 UNIT) CAPS capsule; 1 tab every wed and 1 tab every sun (OV for RF)  Dispense: 8 capsule; Refill: 0  3. Tobacco abuse Course: Quit 3 weeks ago.   Plan: commended patient for quitting and reviewed strategies for preventing relapses.  4. Mood disorder (Amy Freeman), with emotional eating Not at goal. Medication: Chantix 0.5 mg daily.  Increased stress with husband's injury, etc.  Mood stable though.  No SI.  Plan:  On Chantix for mood management per PCP.  Personal self help book recommended.  Consider counseling and increase exercise.  Behavior modification techniques were discussed today to help deal with emotional/non-hunger eating behaviors.  5. Obesity, current BMI 43.5  Course: Amy Freeman is currently in the action  stage of change. As such, her goal is to continue with weight loss efforts.   Nutrition goals: She has agreed to the Category 1 Plan or keeping a food journal and adhering to recommended goals of 1000-1100 calories and 80 grams of protein.   Exercise goals: For substantial health benefits, adults should do at least 150 minutes (2 hours and 30 minutes) a week of moderate-intensity, or 75 minutes (1 hour and 15 minutes) a week of vigorous-intensity aerobic physical activity, or an equivalent combination of moderate- and vigorous-intensity aerobic  activity. Aerobic activity should be performed in episodes of at least 10 minutes, and preferably, it should be spread throughout the week.  Behavioral modification strategies: increasing lean protein intake, no skipping meals, emotional eating strategies, avoiding temptations and planning for success.  Arminda has agreed to follow-up with our clinic in 4-8 weeks. She was informed of the importance of frequent follow-up visits to maximize her success with intensive lifestyle modifications for her multiple health conditions.   Objective:   Blood pressure 105/74, pulse 70, temperature 97.9 F (36.6 C), height 5\' 1"  (1.549 m), weight 230 lb (104.3 kg), SpO2 98 %. Body mass index is 43.46 kg/m.  General: Cooperative, alert, well developed, in no acute distress. HEENT: Conjunctivae and lids unremarkable. Cardiovascular: Regular rhythm.  Lungs: Normal work of breathing. Neurologic: No focal deficits.   Lab Results  Component Value Date   CREATININE 0.8 12/14/2019   NA 138 12/14/2019   K 4.5 12/14/2019   CL 104 12/14/2019   Lab Results  Component Value Date   ALT 12 12/14/2019   AST 16 12/14/2019   ALKPHOS 103 12/14/2019   Lab Results  Component Value Date   HGBA1C 6.0 (H) 04/17/2020   HGBA1C 6.1 (H) 01/11/2020   Lab Results  Component Value Date   INSULIN 12.2 04/17/2020   INSULIN 14.0 01/11/2020   Lab Results  Component Value Date   TSH 2.10 12/14/2019   Lab Results  Component Value Date   CHOL 193 04/17/2020   HDL 42 04/17/2020   LDLCALC 126 (H) 04/17/2020   TRIG 138 04/17/2020   CHOLHDL 4.6 (H) 04/17/2020   Lab Results  Component Value Date   WBC 6.8 12/14/2019   HGB 14.5 12/14/2019   HCT 43 12/14/2019   PLT 281 12/14/2019   Obesity Behavioral Intervention:   Approximately 15 minutes were spent on the discussion below.  ASK: We discussed the diagnosis of obesity with Amy Freeman today and Amy Freeman agreed to give [place] permission to discuss obesity behavioral  modification therapy today.  ASSESS: Amy Freeman has the diagnosis of obesity and her BMI today is 43.5. Amy Freeman is in the action stage of change.   ADVISE: Amy Freeman was educated on the multiple health risks of obesity as well as the benefit of weight loss to improve her health. She was advised of the need for long term treatment and the importance of lifestyle modifications to improve her current health and to decrease her risk of future health problems.  AGREE: Multiple dietary modification options and treatment options were discussed and Amy Freeman agreed to follow the recommendations documented in the above note.  ARRANGE: Amy Freeman was educated on the importance of frequent visits to treat obesity as outlined per CMS and USPSTF guidelines and agreed to schedule her next follow up appointment today.  Attestation Statements:   Reviewed by clinician on day of visit: allergies, medications, problem list, medical history, surgical history, family history, social history, and previous encounter notes.  I,  Water quality scientist, [place], am acting as Location manager for [employer], DO.  I have reviewed the above documentation for accuracy and completeness, and I agree with the above. Marjory Sneddon, D.O.  The [place] was signed into law in 2016 which includes the topic of electronic health records.  This provides immediate access to information in MyChart.  This includes consultation notes, operative notes, office notes, lab results and pathology reports.  If you have any questions about what you read please let us know at your next visit so we can discuss your concerns and take corrective action if need be.  We are right here with you.

## 2020-10-24 ENCOUNTER — Ambulatory Visit (INDEPENDENT_AMBULATORY_CARE_PROVIDER_SITE_OTHER): Payer: Medicare HMO | Admitting: Family Medicine

## 2020-11-21 DIAGNOSIS — H524 Presbyopia: Secondary | ICD-10-CM | POA: Diagnosis not present

## 2020-11-21 DIAGNOSIS — Z0100 Encounter for examination of eyes and vision without abnormal findings: Secondary | ICD-10-CM | POA: Diagnosis not present

## 2020-12-25 DIAGNOSIS — E782 Mixed hyperlipidemia: Secondary | ICD-10-CM | POA: Diagnosis not present

## 2020-12-25 DIAGNOSIS — E559 Vitamin D deficiency, unspecified: Secondary | ICD-10-CM | POA: Diagnosis not present

## 2020-12-30 DIAGNOSIS — K5900 Constipation, unspecified: Secondary | ICD-10-CM | POA: Insufficient documentation

## 2020-12-30 DIAGNOSIS — R030 Elevated blood-pressure reading, without diagnosis of hypertension: Secondary | ICD-10-CM | POA: Insufficient documentation

## 2020-12-30 DIAGNOSIS — G5603 Carpal tunnel syndrome, bilateral upper limbs: Secondary | ICD-10-CM | POA: Insufficient documentation

## 2021-02-26 ENCOUNTER — Other Ambulatory Visit (HOSPITAL_COMMUNITY): Payer: Self-pay | Admitting: Family Medicine

## 2021-02-26 DIAGNOSIS — E782 Mixed hyperlipidemia: Secondary | ICD-10-CM | POA: Diagnosis not present

## 2021-02-26 DIAGNOSIS — E559 Vitamin D deficiency, unspecified: Secondary | ICD-10-CM | POA: Diagnosis not present

## 2021-02-26 DIAGNOSIS — G5603 Carpal tunnel syndrome, bilateral upper limbs: Secondary | ICD-10-CM | POA: Diagnosis not present

## 2021-02-26 DIAGNOSIS — K5900 Constipation, unspecified: Secondary | ICD-10-CM | POA: Diagnosis not present

## 2021-02-26 DIAGNOSIS — Z6841 Body Mass Index (BMI) 40.0 and over, adult: Secondary | ICD-10-CM | POA: Diagnosis not present

## 2021-02-26 DIAGNOSIS — Z1231 Encounter for screening mammogram for malignant neoplasm of breast: Secondary | ICD-10-CM | POA: Diagnosis not present

## 2021-02-26 DIAGNOSIS — R030 Elevated blood-pressure reading, without diagnosis of hypertension: Secondary | ICD-10-CM | POA: Diagnosis not present

## 2021-02-26 DIAGNOSIS — Z0001 Encounter for general adult medical examination with abnormal findings: Secondary | ICD-10-CM | POA: Diagnosis not present

## 2021-08-25 DIAGNOSIS — E782 Mixed hyperlipidemia: Secondary | ICD-10-CM | POA: Diagnosis not present

## 2021-08-25 DIAGNOSIS — E559 Vitamin D deficiency, unspecified: Secondary | ICD-10-CM | POA: Diagnosis not present

## 2021-08-26 DIAGNOSIS — E875 Hyperkalemia: Secondary | ICD-10-CM | POA: Insufficient documentation

## 2021-08-27 ENCOUNTER — Other Ambulatory Visit (HOSPITAL_COMMUNITY): Payer: Self-pay | Admitting: Family Medicine

## 2021-08-27 DIAGNOSIS — Z1231 Encounter for screening mammogram for malignant neoplasm of breast: Secondary | ICD-10-CM | POA: Diagnosis not present

## 2021-08-27 DIAGNOSIS — K5900 Constipation, unspecified: Secondary | ICD-10-CM | POA: Diagnosis not present

## 2021-08-27 DIAGNOSIS — Z6841 Body Mass Index (BMI) 40.0 and over, adult: Secondary | ICD-10-CM | POA: Diagnosis not present

## 2021-08-27 DIAGNOSIS — L84 Corns and callosities: Secondary | ICD-10-CM | POA: Diagnosis not present

## 2021-08-27 DIAGNOSIS — E875 Hyperkalemia: Secondary | ICD-10-CM | POA: Diagnosis not present

## 2021-08-27 DIAGNOSIS — E559 Vitamin D deficiency, unspecified: Secondary | ICD-10-CM | POA: Diagnosis not present

## 2021-08-27 DIAGNOSIS — E782 Mixed hyperlipidemia: Secondary | ICD-10-CM | POA: Diagnosis not present

## 2021-08-27 DIAGNOSIS — G5603 Carpal tunnel syndrome, bilateral upper limbs: Secondary | ICD-10-CM | POA: Diagnosis not present

## 2021-09-09 ENCOUNTER — Encounter: Payer: Self-pay | Admitting: Podiatrist

## 2021-09-09 ENCOUNTER — Ambulatory Visit: Payer: Medicare HMO | Admitting: Podiatrist

## 2021-09-09 DIAGNOSIS — L84 Corns and callosities: Secondary | ICD-10-CM

## 2021-09-09 DIAGNOSIS — M2042 Other hammer toe(s) (acquired), left foot: Secondary | ICD-10-CM | POA: Diagnosis not present

## 2021-09-09 NOTE — Progress Notes (Signed)
?Chief Complaint  ?Patient presents with  ? Callouses  ?  (New Patient) ?Diagnosis: Corn of toe Referring Provider: Valentino Nose  ?  ? ?HPI: Patient is 71 y.o. female who presents today for corn left fift toe-she has had surgery in the past for hammertoes of the fifth digits.  She relates that the right fifth toe has done well however the left fifth toe has contracted a little and now she has a painful corn present.  She states that she went to the pedicure salon and they trimmed away the skin and now feels much better.  She denies seeing any redness or swelling denies any drainage from the toe. ? ?Patient Active Problem List  ? Diagnosis Date Noted  ? Corn of toe 08/27/2021  ? Hyperkalemia 08/26/2021  ? Bilateral carpal tunnel syndrome 12/30/2020  ? Constipation 12/30/2020  ? Elevated blood-pressure reading without diagnosis of hypertension 12/30/2020  ? At risk for anxiety 07/30/2020  ? At risk for deficient intake of food 07/10/2020  ? At risk for diabetes mellitus 06/10/2020  ? Smoking 05/27/2020  ? Mood disorder ([place]), with emotional eating 05/27/2020  ? At risk for depression 05/01/2020  ? Prediabetes 04/17/2020  ? Other hyperlipidemia with hypertriglyceridemia 04/17/2020  ? Vitamin D deficiency 04/17/2020  ? At risk for heart disease 04/17/2020  ? At risk for side effect of medication 04/01/2020  ? Class 3 severe obesity with serious comorbidity and body mass index (BMI) of 40.0 to 44.9 in adult [facility]) 04/01/2020  ? Tobacco abuse 03/13/2020  ? Depression 02/26/2020  ? Encounter for screening fecal occult blood testing 01/04/2020  ? Encounter for gynecological examination with Papanicolaou smear of cervix 01/04/2020  ? ? ?Current Outpatient Medications on File Prior to Visit  ?Medication Sig Dispense Refill  ? linaclotide (LINZESS) 145 MCG CAPS capsule Take 145 mcg by mouth daily before breakfast.    ? PRESCRIPTION MEDICATION Vitamin D3 1x week    ? SHINGRIX injection     ? varenicline (CHANTIX) 0.5 MG tablet  Take 0.5 mg by mouth 2 (two) times daily.    ? Vitamin D, Ergocalciferol, (DRISDOL) 1.25 MG (50000 UNIT) CAPS capsule 1 tab every wed and 1 tab every sun (OV for RF) 8 capsule 0  ? ?No current facility-administered medications on file prior to visit.  ? ? ?No Known Allergies ? ?Review of Systems ?No fevers, chills, nausea, muscle aches, no difficulty breathing, no calf pain, no chest pain or shortness of breath. ? ? ?Physical Exam ? ?GENERAL APPEARANCE: Alert, conversant. Appropriately groomed. No acute distress.  ? ?VASCULAR: Pedal pulses palpable DP and PT bilateral.  Capillary refill time is immediate to all digits,  Proximal to distal cooling it warm to warm.  Digital perfusion adequate.  ? ?NEUROLOGIC: sensation is intact to 5.07 monofilament at 5/5 sites bilateral.  Light touch is intact bilateral, vibratory sensation intact bilateral ? ?MUSCULOSKELETAL: Contracture of the right fifth digit is noted in comparison with the left fifth digit.  Residual hammering of the right fifth digit is also noted.   ? ?DERMATOLOGIC: skin is warm, supple, and dry.  Well-circumscribed hyperkeratotic lesion is present at the left fifth toe laterally.  Pain with direct pressure and medial to lateral compression is noted. ? ? ?Assessment  ? ?  ICD-10-CM   ?1. Hammertoe of left foot  M20.42   ?  ?2. Corns and callosities  L84   ?  ? ? ? ?Plan ? ?Discussed exam findings with the patient.  Discussed that the residual hammertoe deformity is causing pressure on the shoes and she is developing a callus.  I recommended use of a pumice stone or file to keep the skin trimmed down to a comfortable level.  Also recommended a pad on the toe to prevent friction and rub.  The patient will try these recommendations and if she continues to have pain she will call discussed ultimately she may need a second surgery on this left fifth toe to straighten the toe and/or remove the bone spur.  She will call if she would like a follow-up visit. ?

## 2021-09-09 NOTE — Patient Instructions (Signed)
Corns and Calluses Corns are small areas of thickened skin that form on the top, sides, or tip of a toe. Corns have a cone-shaped core with a point that can press on a nerve below. This causes pain. Calluses are areas of thickened skin that can form anywhere on the body, including the hands, fingers, palms, soles of the feet, and heels. Calluses are usually larger than corns. What are the causes? Corns and calluses are caused by rubbing (friction) or pressure, such as from shoes that are too tight or do not fit properly. What increases the risk? Corns are more likely to develop in people who have misshapen toes (toe deformities), such as hammer toes. Calluses can form with friction to any area of the skin. They are more likely to develop in people who: Work with their hands. Wear shoes that fit poorly, are too tight, or are high-heeled. Have toe deformities. What are the signs or symptoms? Symptoms of a corn or callus include: A hard growth on the skin. Pain or tenderness under the skin. Redness and swelling. Increased discomfort while wearing tight-fitting shoes, if your feet are affected. If a corn or callus becomes infected, symptoms may include: Redness and swelling that gets worse. Pain. Fluid, blood, or pus draining from the corn or callus. How is this diagnosed? Corns and calluses may be diagnosed based on your symptoms, your medical history, and a physical exam. How is this treated? Treatment for corns and calluses may include: Removing the cause of the friction or pressure. This may involve: Changing your shoes. Wearing shoe inserts (orthotics) or other protective layers in your shoes, such as a corn pad. Wearing gloves. Applying medicine to the skin (topical medicine) to help soften skin in the hardened, thickened areas. Removing layers of dead skin with a file to reduce the size of the corn or callus. Removing the corn or callus with a scalpel or laser. Taking antibiotic  medicines, if your corn or callus is infected. Having surgery, if a toe deformity is the cause. Follow these instructions at home:  Take over-the-counter and prescription medicines only as told by your health care provider. If you were prescribed an antibiotic medicine, take it as told by your health care provider. Do not stop taking it even if your condition improves. Wear shoes that fit well. Avoid wearing high-heeled shoes and shoes that are too tight or too loose. Wear any padding, protective layers, gloves, or orthotics as told by your health care provider. Soak your hands or feet. Then use a file or pumice stone to soften your corn or callus. Do this as told by your health care provider. Check your corn or callus every day for signs of infection. Contact a health care provider if: Your symptoms do not improve with treatment. You have redness or swelling that gets worse. Your corn or callus becomes painful. You have fluid, blood, or pus coming from your corn or callus. You have new symptoms. Get help right away if: You develop severe pain with redness. Summary Corns are small areas of thickened skin that form on the top, sides, or tip of a toe. These can be painful. Calluses are areas of thickened skin that can form anywhere on the body, including the hands, fingers, palms, and soles of the feet. Calluses are usually larger than corns. Corns and calluses are caused by rubbing (friction) or pressure, such as from shoes that are too tight or do not fit properly. Treatment may include wearing padding, protective   layers, gloves, or orthotics as told by your health care provider. This information is not intended to replace advice given to you by your health care provider. Make sure you discuss any questions you have with your health care provider. Document Revised: 09/07/2019 Document Reviewed: 09/07/2019 Elsevier Patient Education  2023 [employer]  

## 2021-10-06 ENCOUNTER — Ambulatory Visit (HOSPITAL_COMMUNITY)
Admission: RE | Admit: 2021-10-06 | Discharge: 2021-10-06 | Disposition: A | Discharge status: Home / Self Care | Payer: Medicare HMO | Source: Ambulatory Visit | Attending: Family Medicine | Admitting: Family Medicine

## 2021-10-06 DIAGNOSIS — Z1231 Encounter for screening mammogram for malignant neoplasm of breast: Secondary | ICD-10-CM | POA: Diagnosis not present

## 2021-12-16 DIAGNOSIS — Z0100 Encounter for examination of eyes and vision without abnormal findings: Secondary | ICD-10-CM | POA: Diagnosis not present

## 2021-12-16 DIAGNOSIS — H524 Presbyopia: Secondary | ICD-10-CM | POA: Diagnosis not present

## 2021-12-16 DIAGNOSIS — H43393 Other vitreous opacities, bilateral: Secondary | ICD-10-CM | POA: Diagnosis not present

## 2021-12-31 ENCOUNTER — Encounter (INDEPENDENT_AMBULATORY_CARE_PROVIDER_SITE_OTHER): Payer: Self-pay

## 2022-02-24 DIAGNOSIS — E559 Vitamin D deficiency, unspecified: Secondary | ICD-10-CM | POA: Diagnosis not present

## 2022-02-24 DIAGNOSIS — E782 Mixed hyperlipidemia: Secondary | ICD-10-CM | POA: Diagnosis not present

## 2022-03-03 DIAGNOSIS — E559 Vitamin D deficiency, unspecified: Secondary | ICD-10-CM | POA: Diagnosis not present

## 2022-03-03 DIAGNOSIS — R030 Elevated blood-pressure reading, without diagnosis of hypertension: Secondary | ICD-10-CM | POA: Diagnosis not present

## 2022-03-03 DIAGNOSIS — Z6841 Body Mass Index (BMI) 40.0 and over, adult: Secondary | ICD-10-CM | POA: Diagnosis not present

## 2022-03-03 DIAGNOSIS — Z0000 Encounter for general adult medical examination without abnormal findings: Secondary | ICD-10-CM | POA: Diagnosis not present

## 2022-03-03 DIAGNOSIS — G5603 Carpal tunnel syndrome, bilateral upper limbs: Secondary | ICD-10-CM | POA: Diagnosis not present

## 2022-03-03 DIAGNOSIS — K5900 Constipation, unspecified: Secondary | ICD-10-CM | POA: Diagnosis not present

## 2022-03-03 DIAGNOSIS — E782 Mixed hyperlipidemia: Secondary | ICD-10-CM | POA: Diagnosis not present

## 2022-03-25 IMAGING — MG DIGITAL SCREENING BILAT W/ TOMO W/ CAD
8 of 15 series · 8 of 40 positions shown · non-contrast
Comparison: None.

ACR Breast Density Category a: The breast tissue is almost entirely
fatty.

CLINICAL DATA: Screening.

EXAM:
DIGITAL SCREENING BILATERAL MAMMOGRAM WITH TOMO AND CAD

[L MLO synth-2D]
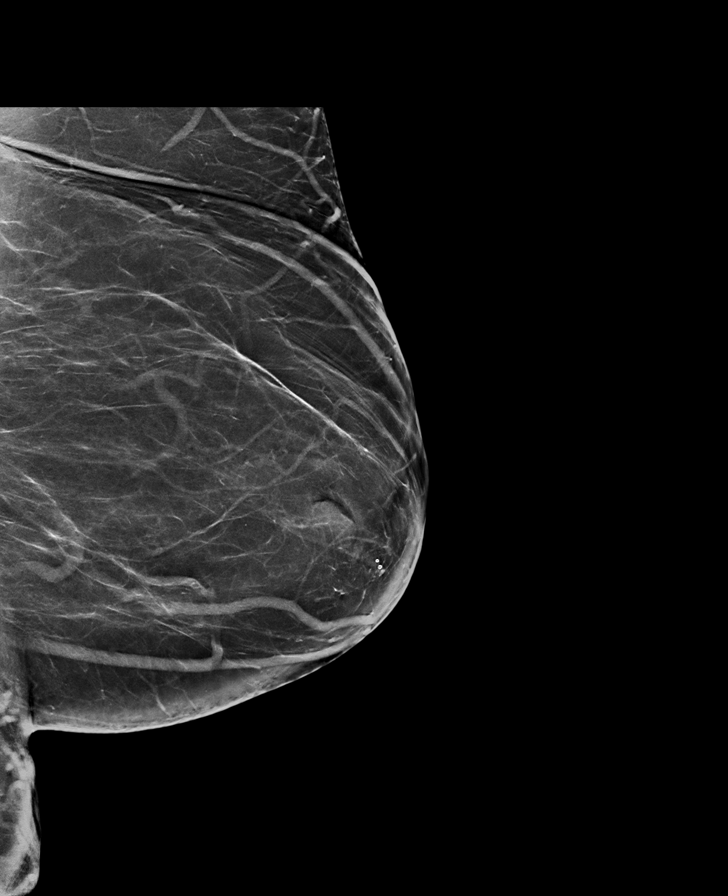

[R CC synth-2D (1 of 2)]
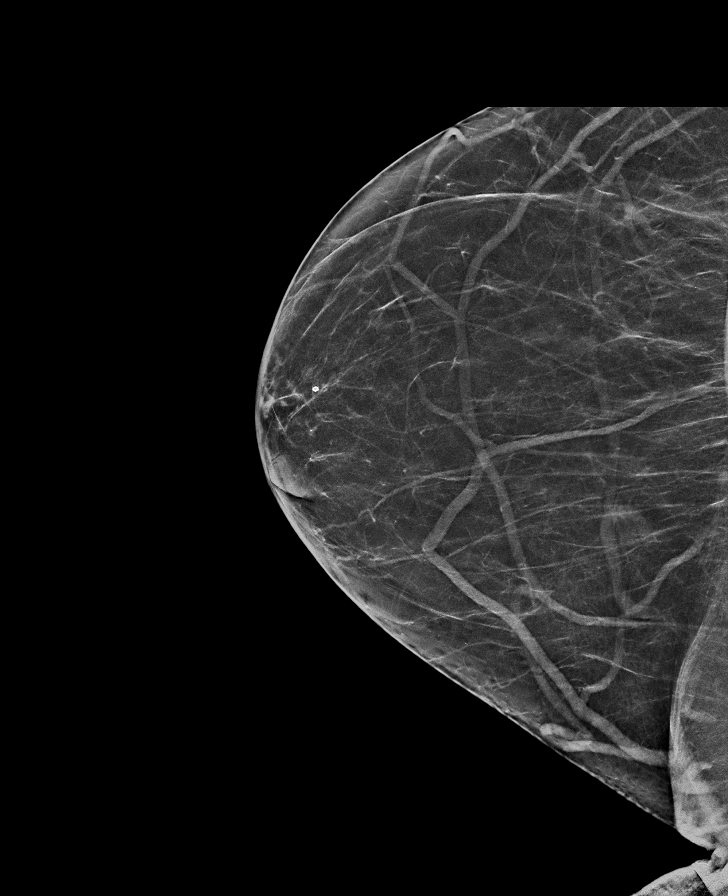

[R CC synth-2D (2 of 2)]
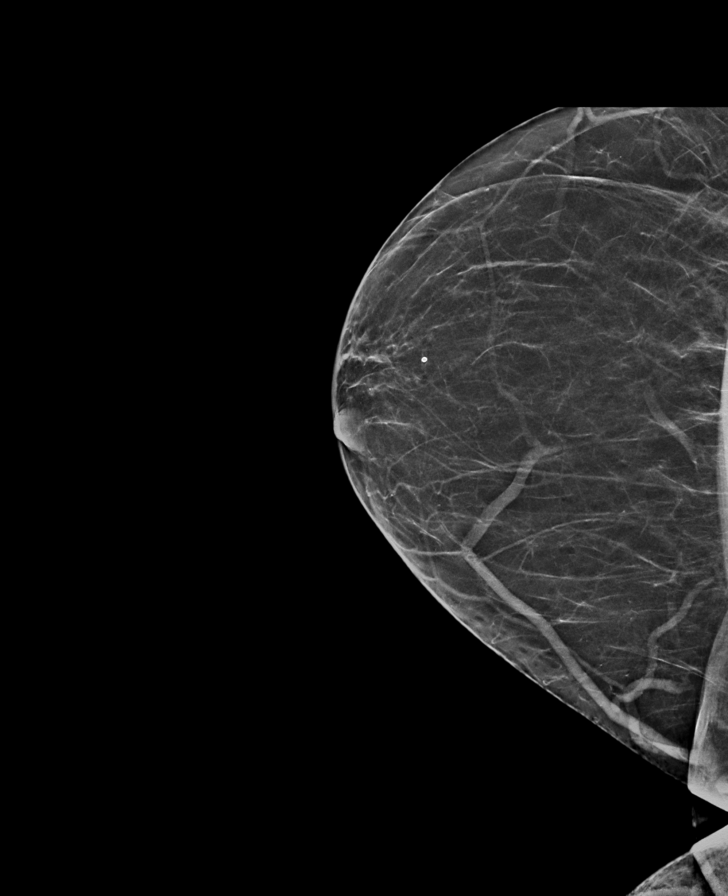

[R MLO synth-2D (1 of 2)]
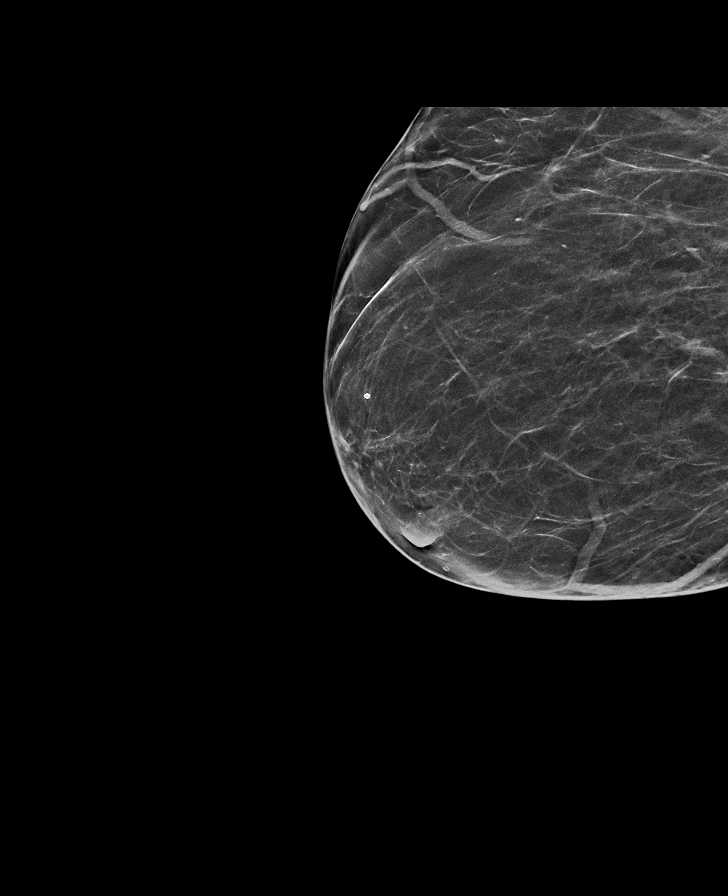

[L CC synth-2D (1 of 2)]
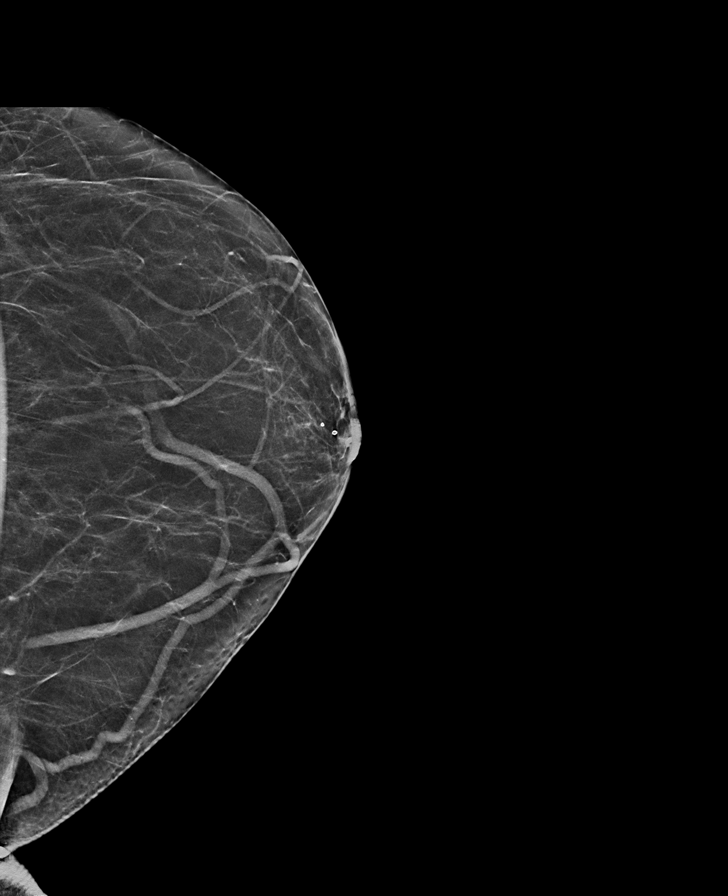

[R MLO synth-2D (2 of 2)]
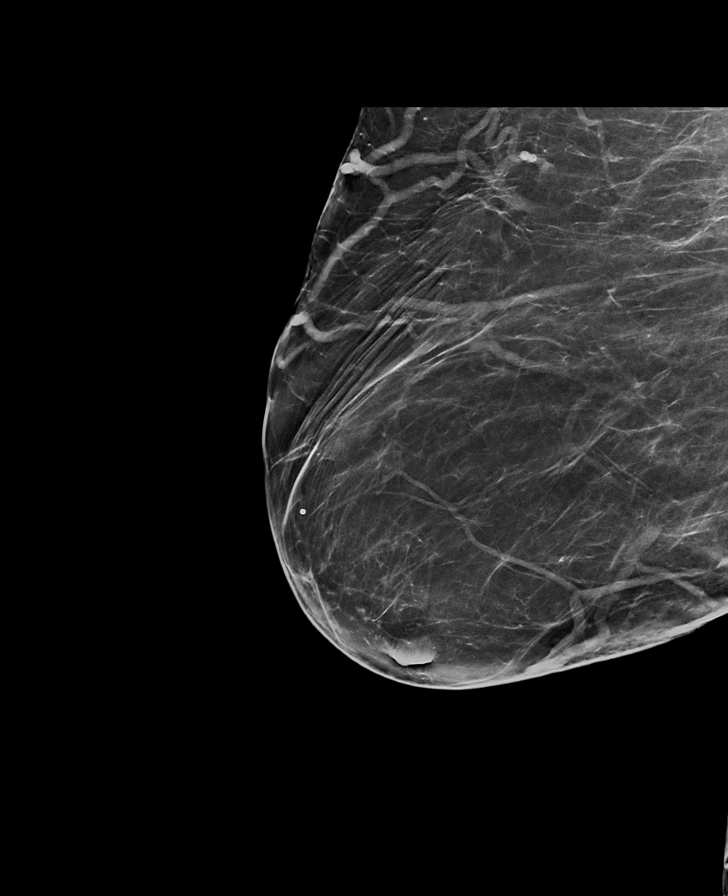

[L CC synth-2D (2 of 2)]
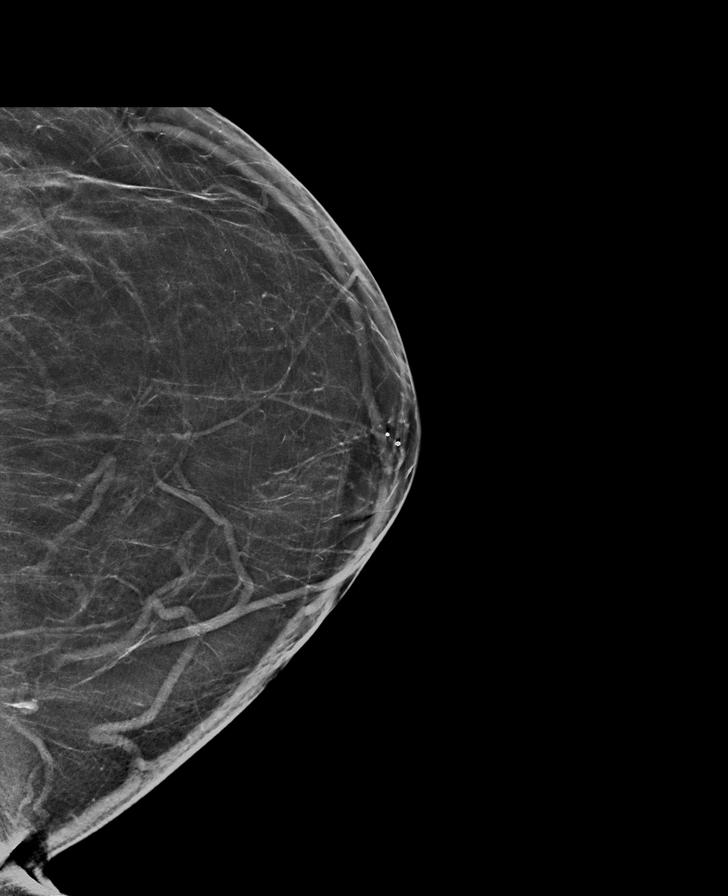

[L CC tomo · tomo slice 38/56.0]
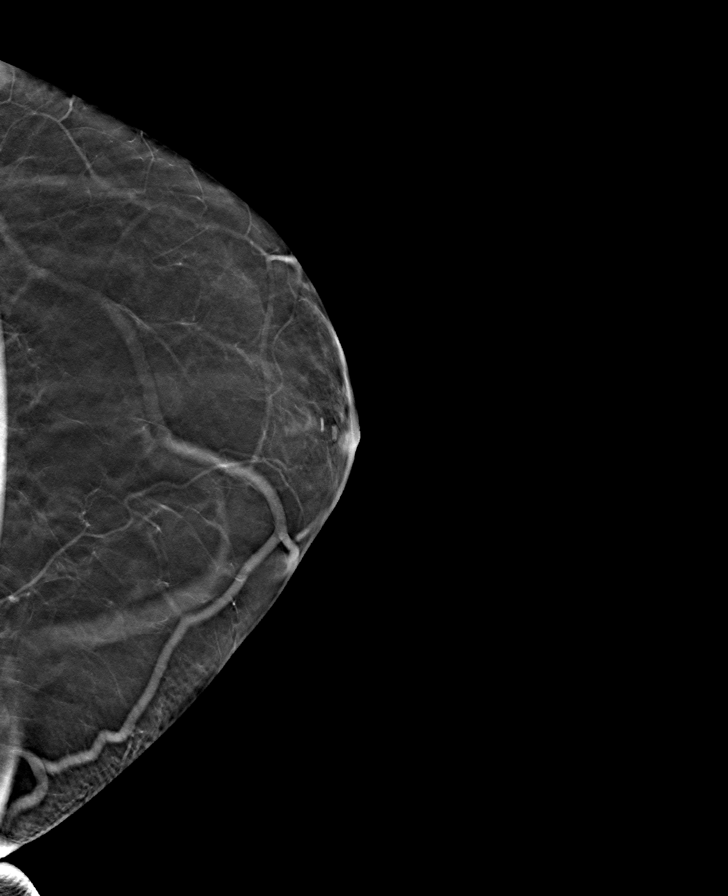

[8 of 40 positions shown; findings below may reference images not displayed]

FINDINGS: There are no findings suspicious for malignancy. Images were
processed with CAD.
IMPRESSION: No mammographic evidence of malignancy. A result letter of this
screening mammogram will be mailed directly to the patient.

RECOMMENDATION:
Screening mammogram in one year. (Code:[id number])

BI-RADS CATEGORY  1: Negative.

## 2022-08-27 DIAGNOSIS — G5603 Carpal tunnel syndrome, bilateral upper limbs: Secondary | ICD-10-CM | POA: Diagnosis not present

## 2022-08-31 DIAGNOSIS — Z0000 Encounter for general adult medical examination without abnormal findings: Secondary | ICD-10-CM | POA: Diagnosis not present

## 2022-09-01 ENCOUNTER — Other Ambulatory Visit (HOSPITAL_COMMUNITY): Payer: Self-pay | Admitting: Internal Medicine

## 2022-09-01 DIAGNOSIS — E782 Mixed hyperlipidemia: Secondary | ICD-10-CM | POA: Diagnosis not present

## 2022-09-01 DIAGNOSIS — Z79899 Other long term (current) drug therapy: Secondary | ICD-10-CM | POA: Diagnosis not present

## 2022-09-01 DIAGNOSIS — Z87891 Personal history of nicotine dependence: Secondary | ICD-10-CM | POA: Diagnosis not present

## 2022-09-01 DIAGNOSIS — E559 Vitamin D deficiency, unspecified: Secondary | ICD-10-CM | POA: Diagnosis not present

## 2022-09-01 DIAGNOSIS — R7303 Prediabetes: Secondary | ICD-10-CM | POA: Diagnosis not present

## 2022-09-01 DIAGNOSIS — K5900 Constipation, unspecified: Secondary | ICD-10-CM | POA: Diagnosis not present

## 2022-09-01 DIAGNOSIS — G5603 Carpal tunnel syndrome, bilateral upper limbs: Secondary | ICD-10-CM | POA: Diagnosis not present

## 2022-09-01 DIAGNOSIS — R030 Elevated blood-pressure reading, without diagnosis of hypertension: Secondary | ICD-10-CM | POA: Diagnosis not present

## 2022-11-03 ENCOUNTER — Ambulatory Visit (HOSPITAL_COMMUNITY)
Admission: RE | Admit: 2022-11-03 | Discharge: 2022-11-03 | Disposition: A | Discharge status: Home / Self Care | Payer: Medicare HMO | Source: Ambulatory Visit | Attending: Internal Medicine | Admitting: Internal Medicine

## 2022-11-03 DIAGNOSIS — Z87891 Personal history of nicotine dependence: Secondary | ICD-10-CM | POA: Insufficient documentation

## 2022-11-16 ENCOUNTER — Encounter (HOSPITAL_COMMUNITY): Payer: Self-pay

## 2022-11-24 ENCOUNTER — Telehealth: Payer: Self-pay

## 2022-11-24 NOTE — Telephone Encounter (Signed)
Transition Care Management Unsuccessful Follow-up Telephone Call  Date of discharge and from where:  [facility] 6/24  Attempts:  1st Attempt  Reason for unsuccessful TCM follow-up call:  No answer/busy   Lenard Forth [facility] Guide, [facility] [phone] [address] [place], [place], [place] [address] Phone: [phone] Email: [email or web address]

## 2022-11-30 ENCOUNTER — Telehealth: Payer: Self-pay

## 2022-11-30 NOTE — Telephone Encounter (Signed)
Transition Care Management Unsuccessful Follow-up Telephone Call  Date of discharge and from where:  [facility] 6/4   Attempts:  2nd Attempt  Reason for unsuccessful TCM follow-up call:  Left voice message   A[facility], [place] [phone] [address], [place], [place] [address] Phone: [phone] Email: [email or web address]       

## 2022-11-30 NOTE — Telephone Encounter (Signed)
Transition Care Management Unsuccessful Follow-up Telephone Call  Date of discharge and from where:  [facility] 6/24  Attempts:  2nd Attempt  Reason for unsuccessful TCM follow-up call:  Left voice message   On[facility] K[facility],  [phone] [address], [place], [place] [address] Phone: [phone] Email: [email or web address]       

## 2023-02-25 DIAGNOSIS — E559 Vitamin D deficiency, unspecified: Secondary | ICD-10-CM | POA: Diagnosis not present

## 2023-02-25 DIAGNOSIS — R7303 Prediabetes: Secondary | ICD-10-CM | POA: Diagnosis not present

## 2023-02-25 DIAGNOSIS — E782 Mixed hyperlipidemia: Secondary | ICD-10-CM | POA: Diagnosis not present

## 2023-03-02 DIAGNOSIS — H524 Presbyopia: Secondary | ICD-10-CM | POA: Diagnosis not present

## 2023-03-02 DIAGNOSIS — H43393 Other vitreous opacities, bilateral: Secondary | ICD-10-CM | POA: Diagnosis not present

## 2023-03-03 DIAGNOSIS — G5603 Carpal tunnel syndrome, bilateral upper limbs: Secondary | ICD-10-CM | POA: Diagnosis not present

## 2023-03-03 DIAGNOSIS — L853 Xerosis cutis: Secondary | ICD-10-CM | POA: Diagnosis not present

## 2023-03-03 DIAGNOSIS — E782 Mixed hyperlipidemia: Secondary | ICD-10-CM | POA: Diagnosis not present

## 2023-03-03 DIAGNOSIS — E669 Obesity, unspecified: Secondary | ICD-10-CM | POA: Diagnosis not present

## 2023-03-03 DIAGNOSIS — R7303 Prediabetes: Secondary | ICD-10-CM | POA: Diagnosis not present

## 2023-03-03 DIAGNOSIS — Z23 Encounter for immunization: Secondary | ICD-10-CM | POA: Diagnosis not present

## 2023-03-03 DIAGNOSIS — E559 Vitamin D deficiency, unspecified: Secondary | ICD-10-CM | POA: Diagnosis not present

## 2023-03-03 DIAGNOSIS — K5900 Constipation, unspecified: Secondary | ICD-10-CM | POA: Diagnosis not present

## 2023-03-29 DIAGNOSIS — H2513 Age-related nuclear cataract, bilateral: Secondary | ICD-10-CM | POA: Diagnosis not present

## 2023-03-29 DIAGNOSIS — H02831 Dermatochalasis of right upper eyelid: Secondary | ICD-10-CM | POA: Diagnosis not present

## 2023-03-29 DIAGNOSIS — H25013 Cortical age-related cataract, bilateral: Secondary | ICD-10-CM | POA: Diagnosis not present

## 2023-03-29 DIAGNOSIS — H25043 Posterior subcapsular polar age-related cataract, bilateral: Secondary | ICD-10-CM | POA: Diagnosis not present

## 2023-03-29 DIAGNOSIS — H2512 Age-related nuclear cataract, left eye: Secondary | ICD-10-CM | POA: Diagnosis not present

## 2023-06-24 DIAGNOSIS — H2512 Age-related nuclear cataract, left eye: Secondary | ICD-10-CM | POA: Diagnosis not present

## 2023-06-24 DIAGNOSIS — H2513 Age-related nuclear cataract, bilateral: Secondary | ICD-10-CM | POA: Diagnosis not present

## 2023-06-25 DIAGNOSIS — H2511 Age-related nuclear cataract, right eye: Secondary | ICD-10-CM | POA: Diagnosis not present

## 2023-07-15 DIAGNOSIS — H2511 Age-related nuclear cataract, right eye: Secondary | ICD-10-CM | POA: Diagnosis not present

## 2023-07-17 DIAGNOSIS — H2513 Age-related nuclear cataract, bilateral: Secondary | ICD-10-CM | POA: Diagnosis not present

## 2023-08-12 DIAGNOSIS — Z0100 Encounter for examination of eyes and vision without abnormal findings: Secondary | ICD-10-CM | POA: Diagnosis not present

## 2023-08-16 ENCOUNTER — Encounter: Payer: Self-pay | Admitting: Gastroenterology

## 2023-08-27 DIAGNOSIS — E782 Mixed hyperlipidemia: Secondary | ICD-10-CM | POA: Diagnosis not present

## 2023-08-27 DIAGNOSIS — R7303 Prediabetes: Secondary | ICD-10-CM | POA: Diagnosis not present

## 2023-08-27 DIAGNOSIS — E559 Vitamin D deficiency, unspecified: Secondary | ICD-10-CM | POA: Diagnosis not present

## 2023-09-09 DIAGNOSIS — L853 Xerosis cutis: Secondary | ICD-10-CM | POA: Diagnosis not present

## 2023-09-09 DIAGNOSIS — E559 Vitamin D deficiency, unspecified: Secondary | ICD-10-CM | POA: Diagnosis not present

## 2023-09-09 DIAGNOSIS — G5603 Carpal tunnel syndrome, bilateral upper limbs: Secondary | ICD-10-CM | POA: Diagnosis not present

## 2023-09-09 DIAGNOSIS — E782 Mixed hyperlipidemia: Secondary | ICD-10-CM | POA: Diagnosis not present

## 2023-09-09 DIAGNOSIS — R7303 Prediabetes: Secondary | ICD-10-CM | POA: Diagnosis not present

## 2023-09-09 DIAGNOSIS — Z6841 Body Mass Index (BMI) 40.0 and over, adult: Secondary | ICD-10-CM | POA: Diagnosis not present

## 2023-09-09 DIAGNOSIS — Z0001 Encounter for general adult medical examination with abnormal findings: Secondary | ICD-10-CM | POA: Diagnosis not present

## 2023-09-09 DIAGNOSIS — K5900 Constipation, unspecified: Secondary | ICD-10-CM | POA: Diagnosis not present

## 2023-09-27 ENCOUNTER — Other Ambulatory Visit (HOSPITAL_COMMUNITY): Payer: Self-pay | Admitting: Nurse Practitioner

## 2023-09-27 DIAGNOSIS — Z1231 Encounter for screening mammogram for malignant neoplasm of breast: Secondary | ICD-10-CM

## 2023-09-27 DIAGNOSIS — Z13820 Encounter for screening for osteoporosis: Secondary | ICD-10-CM

## 2023-10-04 ENCOUNTER — Encounter (HOSPITAL_COMMUNITY): Payer: Self-pay

## 2023-10-11 ENCOUNTER — Ambulatory Visit (HOSPITAL_COMMUNITY)
Admission: RE | Admit: 2023-10-11 | Discharge: 2023-10-11 | Disposition: A | Discharge status: Home / Self Care | Source: Ambulatory Visit | Attending: Nurse Practitioner | Admitting: Nurse Practitioner

## 2023-10-11 DIAGNOSIS — Z780 Asymptomatic menopausal state: Secondary | ICD-10-CM | POA: Diagnosis not present

## 2023-10-11 DIAGNOSIS — Z1231 Encounter for screening mammogram for malignant neoplasm of breast: Secondary | ICD-10-CM | POA: Diagnosis not present

## 2023-10-11 DIAGNOSIS — Z13820 Encounter for screening for osteoporosis: Secondary | ICD-10-CM | POA: Diagnosis not present

## 2023-10-11 DIAGNOSIS — M8589 Other specified disorders of bone density and structure, multiple sites: Secondary | ICD-10-CM | POA: Insufficient documentation

## 2024-03-06 DIAGNOSIS — R7303 Prediabetes: Secondary | ICD-10-CM | POA: Diagnosis not present

## 2024-03-06 DIAGNOSIS — E559 Vitamin D deficiency, unspecified: Secondary | ICD-10-CM | POA: Diagnosis not present

## 2024-03-06 DIAGNOSIS — E782 Mixed hyperlipidemia: Secondary | ICD-10-CM | POA: Diagnosis not present

## 2024-03-10 ENCOUNTER — Other Ambulatory Visit (HOSPITAL_COMMUNITY): Payer: Self-pay | Admitting: Nurse Practitioner

## 2024-03-10 DIAGNOSIS — Z23 Encounter for immunization: Secondary | ICD-10-CM | POA: Diagnosis not present

## 2024-03-10 DIAGNOSIS — Z122 Encounter for screening for malignant neoplasm of respiratory organs: Secondary | ICD-10-CM

## 2024-03-10 DIAGNOSIS — K5900 Constipation, unspecified: Secondary | ICD-10-CM | POA: Diagnosis not present

## 2024-03-10 DIAGNOSIS — E782 Mixed hyperlipidemia: Secondary | ICD-10-CM | POA: Diagnosis not present

## 2024-03-10 DIAGNOSIS — G5603 Carpal tunnel syndrome, bilateral upper limbs: Secondary | ICD-10-CM | POA: Diagnosis not present

## 2024-03-10 DIAGNOSIS — R7303 Prediabetes: Secondary | ICD-10-CM | POA: Diagnosis not present

## 2024-03-10 DIAGNOSIS — F17210 Nicotine dependence, cigarettes, uncomplicated: Secondary | ICD-10-CM | POA: Diagnosis not present

## 2024-03-10 DIAGNOSIS — E559 Vitamin D deficiency, unspecified: Secondary | ICD-10-CM | POA: Diagnosis not present

## 2024-03-10 DIAGNOSIS — L853 Xerosis cutis: Secondary | ICD-10-CM | POA: Diagnosis not present

## 2024-03-30 ENCOUNTER — Encounter (HOSPITAL_COMMUNITY): Payer: Self-pay

## 2024-03-30 ENCOUNTER — Ambulatory Visit (HOSPITAL_COMMUNITY): Admission: RE | Admit: 2024-03-30 | Source: Ambulatory Visit

## 2024-05-31 ENCOUNTER — Ambulatory Visit (HOSPITAL_COMMUNITY)
Admission: RE | Admit: 2024-05-31 | Discharge: 2024-05-31 | Disposition: A | Discharge status: Home / Self Care | Source: Ambulatory Visit | Attending: Nurse Practitioner | Admitting: Nurse Practitioner

## 2024-05-31 DIAGNOSIS — Z122 Encounter for screening for malignant neoplasm of respiratory organs: Secondary | ICD-10-CM | POA: Insufficient documentation

## 2024-05-31 DIAGNOSIS — I2510 Atherosclerotic heart disease of native coronary artery without angina pectoris: Secondary | ICD-10-CM | POA: Insufficient documentation

## 2024-05-31 DIAGNOSIS — Z87891 Personal history of nicotine dependence: Secondary | ICD-10-CM | POA: Insufficient documentation

## 2024-05-31 DIAGNOSIS — J439 Emphysema, unspecified: Secondary | ICD-10-CM | POA: Diagnosis not present

## 2024-05-31 DIAGNOSIS — K449 Diaphragmatic hernia without obstruction or gangrene: Secondary | ICD-10-CM | POA: Insufficient documentation
